# Patient Record
Sex: Male | Born: 1970
Health system: Southern US, Community
[De-identification: ages and names within clinical notes are randomized; demographics above are authoritative.]

## PROBLEM LIST (undated history)

## (undated) DIAGNOSIS — G4730 Sleep apnea, unspecified: Secondary | ICD-10-CM

## (undated) DIAGNOSIS — E119 Type 2 diabetes mellitus without complications: Secondary | ICD-10-CM

## (undated) DIAGNOSIS — E669 Obesity, unspecified: Secondary | ICD-10-CM

## (undated) DIAGNOSIS — M1990 Unspecified osteoarthritis, unspecified site: Secondary | ICD-10-CM

## (undated) DIAGNOSIS — K219 Gastro-esophageal reflux disease without esophagitis: Secondary | ICD-10-CM

## (undated) DIAGNOSIS — I10 Essential (primary) hypertension: Secondary | ICD-10-CM

## (undated) DIAGNOSIS — K5090 Crohn's disease, unspecified, without complications: Secondary | ICD-10-CM

## (undated) DIAGNOSIS — K589 Irritable bowel syndrome without diarrhea: Secondary | ICD-10-CM

## (undated) DIAGNOSIS — T7840XA Allergy, unspecified, initial encounter: Secondary | ICD-10-CM

## (undated) HISTORY — DX: Allergy, unspecified, initial encounter: T78.40XA

## (undated) HISTORY — PX: OTHER SURGICAL HISTORY: SHX169

## (undated) HISTORY — PX: COLONOSCOPY: SHX174

## (undated) HISTORY — DX: Unspecified osteoarthritis, unspecified site: M19.90

## (undated) HISTORY — DX: Sleep apnea, unspecified: G47.30

## (undated) HISTORY — DX: Type 2 diabetes mellitus without complications: E11.9

---

## 2005-07-31 ENCOUNTER — Emergency Department: Payer: Self-pay | Admitting: Emergency Medicine

## 2005-07-31 ENCOUNTER — Other Ambulatory Visit: Payer: Self-pay

## 2008-11-02 ENCOUNTER — Emergency Department: Payer: Self-pay | Admitting: Emergency Medicine

## 2009-12-01 ENCOUNTER — Emergency Department: Payer: Self-pay | Admitting: Emergency Medicine

## 2011-05-07 ENCOUNTER — Observation Stay: Payer: Self-pay | Admitting: Internal Medicine

## 2011-05-07 LAB — COMPREHENSIVE METABOLIC PANEL
Albumin: 3.7 g/dL (ref 3.4–5.0)
Alkaline Phosphatase: 57 U/L (ref 50–136)
Bilirubin,Total: 0.2 mg/dL (ref 0.2–1.0)
Co2: 26 mmol/L (ref 21–32)
Creatinine: 0.62 mg/dL (ref 0.60–1.30)
EGFR (Non-African Amer.): >60
Glucose: 152 mg/dL — ABNORMAL HIGH (ref 65–99)
Osmolality: 298 (ref 275–301)
SGOT(AST): 20 U/L (ref 15–37)
SGPT (ALT): 37 U/L
Sodium: 149 mmol/L — ABNORMAL HIGH (ref 136–145)

## 2011-05-07 LAB — CK TOTAL AND CKMB (NOT AT ARMC)
CK-MB: 1.7 ng/mL (ref 0.5–3.6)
CK-MB: 1.6 ng/mL (ref 0.5–3.6)
CK-MB: 1.5 ng/mL (ref 0.5–3.6)

## 2011-05-07 LAB — CBC
HCT: 40.1 % (ref 40.0–52.0)
MCH: 29.4 pg (ref 26.0–34.0)
MCV: 88 fL (ref 80–100)
RBC: 4.54 10*6/uL (ref 4.40–5.90)
RDW: 14.9 % — ABNORMAL HIGH (ref 11.5–14.5)
WBC: 8 10*3/uL (ref 3.8–10.6)

## 2011-11-04 ENCOUNTER — Emergency Department: Payer: Self-pay | Admitting: *Deleted

## 2013-03-22 ENCOUNTER — Inpatient Hospital Stay (HOSPITAL_COMMUNITY)
Admission: EM | Admit: 2013-03-22 | Discharge: 2013-03-24 | DRG: 282 | Disposition: A | Discharge status: Home / Self Care | Payer: Self-pay | Attending: Internal Medicine | Admitting: Internal Medicine

## 2013-03-22 ENCOUNTER — Encounter (HOSPITAL_COMMUNITY): Payer: Self-pay | Admitting: Emergency Medicine

## 2013-03-22 ENCOUNTER — Emergency Department (HOSPITAL_COMMUNITY): Payer: Self-pay

## 2013-03-22 DIAGNOSIS — D649 Anemia, unspecified: Secondary | ICD-10-CM | POA: Diagnosis present

## 2013-03-22 DIAGNOSIS — I214 Non-ST elevation (NSTEMI) myocardial infarction: Principal | ICD-10-CM | POA: Diagnosis present

## 2013-03-22 DIAGNOSIS — Z87891 Personal history of nicotine dependence: Secondary | ICD-10-CM

## 2013-03-22 DIAGNOSIS — I10 Essential (primary) hypertension: Secondary | ICD-10-CM | POA: Diagnosis present

## 2013-03-22 DIAGNOSIS — Z800 Family history of malignant neoplasm of digestive organs: Secondary | ICD-10-CM

## 2013-03-22 DIAGNOSIS — Z833 Family history of diabetes mellitus: Secondary | ICD-10-CM

## 2013-03-22 DIAGNOSIS — Z8249 Family history of ischemic heart disease and other diseases of the circulatory system: Secondary | ICD-10-CM

## 2013-03-22 DIAGNOSIS — R079 Chest pain, unspecified: Secondary | ICD-10-CM

## 2013-03-22 DIAGNOSIS — K219 Gastro-esophageal reflux disease without esophagitis: Secondary | ICD-10-CM | POA: Diagnosis present

## 2013-03-22 DIAGNOSIS — Z6839 Body mass index (BMI) 39.0-39.9, adult: Secondary | ICD-10-CM

## 2013-03-22 DIAGNOSIS — Z79899 Other long term (current) drug therapy: Secondary | ICD-10-CM

## 2013-03-22 DIAGNOSIS — K589 Irritable bowel syndrome without diarrhea: Secondary | ICD-10-CM | POA: Diagnosis present

## 2013-03-22 HISTORY — DX: Obesity, unspecified: E66.9

## 2013-03-22 HISTORY — DX: Essential (primary) hypertension: I10

## 2013-03-22 HISTORY — DX: Irritable bowel syndrome, unspecified: K58.9

## 2013-03-22 HISTORY — DX: Gastro-esophageal reflux disease without esophagitis: K21.9

## 2013-03-22 LAB — BASIC METABOLIC PANEL
BUN: 15 mg/dL (ref 6–23)
CO2: 27 mEq/L (ref 19–32)
Chloride: 105 mEq/L (ref 96–112)
Creatinine, Ser: 0.72 mg/dL (ref 0.50–1.35)
GFR calc Af Amer: >90 mL/min (ref >90)
GFR calc non Af Amer: >90 mL/min (ref >90)
Glucose, Bld: 115 mg/dL — ABNORMAL HIGH (ref 70–99)
Potassium: 3.8 mEq/L (ref 3.5–5.1)

## 2013-03-22 LAB — CBC WITH DIFFERENTIAL/PLATELET
Basophils Relative: 0 % (ref 0–1)
Eosinophils Absolute: 0.1 10*3/uL (ref 0.0–0.7)
HCT: 38 % — ABNORMAL LOW (ref 39.0–52.0)
Hemoglobin: 12.6 g/dL — ABNORMAL LOW (ref 13.0–17.0)
Lymphocytes Relative: 13 % (ref 12–46)
Lymphs Abs: 2.1 10*3/uL (ref 0.7–4.0)
MCH: 29.7 pg (ref 26.0–34.0)
MCHC: 33.2 g/dL (ref 30.0–36.0)
Monocytes Absolute: 1.3 10*3/uL — ABNORMAL HIGH (ref 0.1–1.0)
Monocytes Relative: 8 % (ref 3–12)
Neutrophils Relative %: 78 % — ABNORMAL HIGH (ref 43–77)
Platelets: 276 10*3/uL (ref 150–400)
RBC: 4.24 MIL/uL (ref 4.22–5.81)

## 2013-03-22 LAB — POCT I-STAT TROPONIN I: Troponin i, poc: 0.01 ng/mL (ref 0.00–0.08)

## 2013-03-22 MED ORDER — MORPHINE SULFATE 4 MG/ML IJ SOLN
4.0000 mg | Freq: Once | INTRAMUSCULAR | Status: AC
  Administered 2013-03-22: 4 mg via INTRAVENOUS
  Filled 2013-03-22: qty 1

## 2013-03-22 MED ORDER — MORPHINE SULFATE 4 MG/ML IJ SOLN: 4.0000 mg | Freq: Once | INTRAMUSCULAR | Status: DC

## 2013-03-22 MED ORDER — CALCIUM CARBONATE ANTACID 500 MG PO CHEW
1.0000 | CHEWABLE_TABLET | Freq: Once | ORAL | Status: AC
  Administered 2013-03-22: 200 mg via ORAL
  Filled 2013-03-22: qty 1

## 2013-03-22 MED ORDER — MORPHINE SULFATE 4 MG/ML IJ SOLN
5.0000 mg | Freq: Once | INTRAMUSCULAR | Status: AC
  Administered 2013-03-22: 5 mg via INTRAVENOUS
  Filled 2013-03-22: qty 2

## 2013-03-22 MED ORDER — NITROGLYCERIN 2 % TD OINT
1.0000 [in_us] | TOPICAL_OINTMENT | Freq: Four times a day (QID) | TRANSDERMAL | Status: DC
  Administered 2013-03-22: 1 [in_us] via TOPICAL
  Filled 2013-03-22: qty 1

## 2013-03-22 MED ORDER — ONDANSETRON HCL 4 MG/2ML IJ SOLN
INTRAMUSCULAR | Status: AC
  Filled 2013-03-22: qty 2

## 2013-03-22 MED ORDER — ONDANSETRON HCL 4 MG/2ML IJ SOLN
4.0000 mg | Freq: Once | INTRAMUSCULAR | Status: AC
  Administered 2013-03-22: 4 mg via INTRAVENOUS

## 2013-03-22 NOTE — ED Notes (Signed)
Pt up to b/r, belching as he moves, sudden onset of feeling better with large grandiose burp.

## 2013-03-22 NOTE — ED Notes (Signed)
EMS-pt reports sudden onset of crushing left sided chest pain associated with nausea and burping. Pain initially rated pain 8/10, pt given 5 nitros en route and pain decreased to 5/10. Pt reports headache now after nitro which he rates 3/10. 18(L) hand. Pt received 1500 en route due to blood pressure decreasing after nitro given. 324ASA given.

## 2013-03-22 NOTE — ED Notes (Signed)
EDP at bedside  

## 2013-03-22 NOTE — ED Notes (Signed)
O2 [place] re-placed back on pt, SPO2 RA 92%, c/o CP 5/10 improved after morphine, reports no change of pain after ntg given by EMS, has developed HA from ntg. Denies sx other than pain, alert, NAD, calm, interactive, resps e/u, speakingini clear complete setnences, CBIR, family x2 at [facility].

## 2013-03-22 NOTE — ED Notes (Signed)
HA gone. CP remains, comes and goes now. 3/10 at this time. No aggravating or aleviating factors, resting comfortably at this time.

## 2013-03-22 NOTE — ED Provider Notes (Signed)
CSN: [id number]     Arrival date & time 03/22/13  2026 History   First MD Initiated Contact with Patient 03/22/13 2030     Chief Complaint  Patient presents with  . Chest Pain   (Consider location/radiation/quality/duration/timing/severity/associated sxs/prior Treatment) Patient is a 42 y.o. male presenting with chest pain. The history is provided by the patient. No language interpreter was used.  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 Relieved by:  Nothing (improved w/ nitroglycerin) Worsened by:  Nothing tried Ineffective treatments:  Nitroglycerin Associated symptoms: diaphoresis and nausea   Associated symptoms: no abdominal pain, no back pain, no cough, no dizziness, no dysphagia, no fatigue, no fever, no headache, no numbness, no shortness of breath, no syncope, not vomiting and no weakness   Associated symptoms comment:  Tingling in L arm Risk factors: hypertension, male sex and obesity   Risk factors: no coronary artery disease, no diabetes mellitus, no prior DVT/PE and no smoking (quit 8 yrs)     Past Medical History  Diagnosis Date  . GERD (gastroesophageal reflux disease)   . Hypertension    History reviewed. No pertinent past surgical history. History reviewed. No pertinent family history. History  Substance Use Topics  . Smoking status: Never Smoker   . Smokeless tobacco: Not on file  . Alcohol Use: Not on file    Review of Systems  Constitutional: Positive for diaphoresis. Negative for fever, activity change, appetite change and fatigue.  HENT: Negative for congestion, facial swelling, rhinorrhea and trouble swallowing.   Eyes: Negative for photophobia and pain.  Respiratory: Negative for  cough, chest tightness and shortness of breath.   Cardiovascular: Positive for chest pain. Negative for leg swelling and syncope.  Gastrointestinal: Positive for nausea. Negative for vomiting, abdominal pain, diarrhea and constipation.  Endocrine: Negative for polydipsia and polyuria.  Genitourinary: Negative for dysuria, urgency, decreased urine volume and difficulty urinating.  Musculoskeletal: Negative for back pain and gait problem.  Skin: Negative for color change, rash and wound.  Allergic/Immunologic: Negative for immunocompromised state.  Neurological: Negative for dizziness, facial asymmetry, speech difficulty, weakness, numbness and headaches.  Psychiatric/Behavioral: Negative for confusion, decreased concentration and agitation.    Allergies  Review of patient's allergies indicates no known allergies.  Home Medications   Current Outpatient Rx  Name  Route  Sig  Dispense  Refill  . cetirizine (ZYRTEC) 10 MG tablet   Oral   Take 10 mg by mouth daily.         Marland Kitchen esomeprazole (NEXIUM) 40 MG capsule   Oral   Take 40 mg by mouth daily at 12 noon.         Marland Kitchen lisinopril (PRINIVIL,ZESTRIL) 10 MG tablet   Oral   Take 10 mg by mouth daily.          BP 124/75  Pulse 60  Temp(Src) 97.8 F (36.6 C) (Oral)  Resp 15  Ht 6\' 4"  (1.93 m)  Wt 320 lb (145.151 kg)  BMI 38.97 kg/m2  SpO2 98% Physical Exam  Constitutional: He is oriented to person, place, and time. He appears well-developed and well-nourished. No distress.  obese  HENT:  Head: Normocephalic and atraumatic.  Mouth/Throat: No oropharyngeal exudate.  Eyes: Pupils are equal, round, and reactive to light.  Neck: Normal range of motion. Neck supple.  Cardiovascular: Normal rate, regular rhythm and normal heart sounds.  Exam reveals no gallop and no friction rub.   No murmur heard. Pulmonary/Chest: Effort normal and breath sounds normal. No respiratory distress. He has no wheezes. He has no rales.  Abdominal:  Soft. Bowel sounds are normal. He exhibits no distension and no mass. There is no tenderness. There is no rebound and no guarding.  Musculoskeletal: Normal range of motion. He exhibits no edema and no tenderness.  Neurological: He is alert and oriented to person, place, and time.  Skin: Skin is warm and dry.  Psychiatric: He has a normal mood and affect.    ED Course  Procedures (including critical care time) Labs Review Labs Reviewed  CBC WITH DIFFERENTIAL - Abnormal; Notable for the following:    WBC 16.2 (*)    Hemoglobin 12.6 (*)    HCT 38.0 (*)    Neutrophils Relative % 78 (*)    Neutro Abs 12.7 (*)    Monocytes Absolute 1.3 (*)    All other components within normal limits  BASIC METABOLIC PANEL - Abnormal; Notable for the following:    Glucose, Bld 115 (*)    All other components within normal limits  POCT I-STAT TROPONIN I   Imaging Review Dg Chest Portable 1 View  03/22/2013   CLINICAL DATA:  Left chest pain.  EXAM: PORTABLE CHEST - 1 VIEW  COMPARISON:  None.  FINDINGS: Portable view of the chest demonstrates low lung volumes. No evidence for edema or focal airspace disease. Heart size is within normal limits.  IMPRESSION: Low lung volumes without focal disease.   Electronically Signed   By: Richarda Overlie M.D.   On: 03/22/2013 21:15    EKG Interpretation    Date/Time:  Thursday March 22 2013 20:36:12 EST Ventricular Rate:  56 PR Interval:  150 QRS Duration: 103 QT Interval:  476 QTC Calculation: 459 R Axis:   -15 Text Interpretation:  Sinus rhythm Borderline left axis deviation Nonspecific T abnormalities, diffuse leads Baseline wander in lead(s) II III aVR aVF V1 No previous tracing Confirmed by Ludella Pranger  MD, Ymani Porcher ([id number]) on 03/22/2013 9:00:43 PM Also confirmed by Micheline Maze  MD, Claudean Leavelle [phone])  on 03/22/2013 11:18:37 PM            MDM   1. Chest pain    Pt is a 42 y.o. male with Pmhx as above who presents with sudden onset severe chest tightness with assoc  nausea, diaphoresis, belching, and tingling in L arm.  Pain 8/10 initially, 6/10 upon my exam.  EKG as above w/o prior available.  NTG paste placed, pt given 6mg  morphine, peptobismal.  ASA given by EMS. CXR unremarkable, first trop negative.  However, I feel symptoms concerning for ACS.  Cardiology consulted, feels he is safe for admission to internal medicine.  [facility] will admit to stepdown as pt's pain currently 1/10.        Shanna Cisco, MD 03/23/13 [phone]

## 2013-03-22 NOTE — ED Notes (Signed)
EDP Dr. Micheline Maze in to room, at [facility] to update pt/family.

## 2013-03-23 ENCOUNTER — Inpatient Hospital Stay (HOSPITAL_COMMUNITY): Payer: Self-pay

## 2013-03-23 ENCOUNTER — Encounter (HOSPITAL_COMMUNITY): Payer: Self-pay | Admitting: Internal Medicine

## 2013-03-23 ENCOUNTER — Encounter (HOSPITAL_COMMUNITY)
Admission: EM | Disposition: A | Discharge status: Home / Self Care | Payer: Self-pay | Source: Home / Self Care | Attending: Internal Medicine

## 2013-03-23 DIAGNOSIS — I214 Non-ST elevation (NSTEMI) myocardial infarction: Secondary | ICD-10-CM | POA: Diagnosis present

## 2013-03-23 DIAGNOSIS — R079 Chest pain, unspecified: Secondary | ICD-10-CM

## 2013-03-23 DIAGNOSIS — I10 Essential (primary) hypertension: Secondary | ICD-10-CM | POA: Diagnosis present

## 2013-03-23 HISTORY — PX: LEFT HEART CATHETERIZATION WITH CORONARY ANGIOGRAM: SHX5451

## 2013-03-23 LAB — HEMOGLOBIN A1C
Hgb A1c MFr Bld: 5.3 % (ref <5.7)
Mean Plasma Glucose: 105 mg/dL (ref <117)

## 2013-03-23 LAB — CBC WITH DIFFERENTIAL/PLATELET
Basophils Relative: 0 % (ref 0–1)
Eosinophils Absolute: 0 10*3/uL (ref 0.0–0.7)
Eosinophils Relative: 0 % (ref 0–5)
HCT: 37.7 % — ABNORMAL LOW (ref 39.0–52.0)
Hemoglobin: 12.1 g/dL — ABNORMAL LOW (ref 13.0–17.0)
Lymphocytes Relative: 12 % (ref 12–46)
Lymphs Abs: 1.3 10*3/uL (ref 0.7–4.0)
MCH: 28.8 pg (ref 26.0–34.0)
MCHC: 32.1 g/dL (ref 30.0–36.0)
MCV: 89.8 fL (ref 78.0–100.0)
Monocytes Absolute: 0.5 10*3/uL (ref 0.1–1.0)
Monocytes Relative: 5 % (ref 3–12)
Platelets: 287 10*3/uL (ref 150–400)
RBC: 4.2 MIL/uL — ABNORMAL LOW (ref 4.22–5.81)

## 2013-03-23 LAB — LIPID PANEL
Cholesterol: 107 mg/dL (ref 0–200)
LDL Cholesterol: 64 mg/dL (ref 0–99)
Triglycerides: 52 mg/dL (ref <150)
VLDL: 10 mg/dL (ref 0–40)

## 2013-03-23 LAB — COMPREHENSIVE METABOLIC PANEL
ALT: 22 U/L (ref 0–53)
AST: 17 U/L (ref 0–37)
Albumin: 3.8 g/dL (ref 3.5–5.2)
CO2: 24 mEq/L (ref 19–32)
Chloride: 109 mEq/L (ref 96–112)
Creatinine, Ser: 0.54 mg/dL (ref 0.50–1.35)
GFR calc non Af Amer: >90 mL/min (ref >90)
Sodium: 142 mEq/L (ref 135–145)
Total Bilirubin: 0.3 mg/dL (ref 0.3–1.2)
Total Protein: 6.5 g/dL (ref 6.0–8.3)

## 2013-03-23 LAB — TROPONIN I
Troponin I: 2.22 ng/mL (ref <0.30)
Troponin I: 3.26 ng/mL (ref <0.30)

## 2013-03-23 LAB — PROTIME-INR
INR: 1.1 (ref 0.00–1.49)
Prothrombin Time: 14 seconds (ref 11.6–15.2)

## 2013-03-23 LAB — TSH: TSH: 0.782 u[IU]/mL (ref 0.350–4.500)

## 2013-03-23 SURGERY — LEFT HEART CATHETERIZATION WITH CORONARY ANGIOGRAM
Anesthesia: LOCAL

## 2013-03-23 MED ORDER — ACETAMINOPHEN 325 MG PO TABS
650.0000 mg | ORAL_TABLET | Freq: Four times a day (QID) | ORAL | Status: DC | PRN
Start: 2013-03-23 — End: 2013-03-24
  Administered 2013-03-23: 650 mg via ORAL
  Filled 2013-03-23: qty 2

## 2013-03-23 MED ORDER — ACETAMINOPHEN 325 MG PO TABS: 650.0000 mg | ORAL_TABLET | ORAL | Status: DC | PRN

## 2013-03-23 MED ORDER — ATORVASTATIN CALCIUM 80 MG PO TABS
80.0000 mg | ORAL_TABLET | Freq: Every day | ORAL | Status: DC
  Filled 2013-03-23 (×2): qty 1

## 2013-03-23 MED ORDER — HEPARIN SODIUM (PORCINE) 1000 UNIT/ML IJ SOLN
INTRAMUSCULAR | Status: AC
  Filled 2013-03-23: qty 1

## 2013-03-23 MED ORDER — ASPIRIN EC 325 MG PO TBEC
325.0000 mg | DELAYED_RELEASE_TABLET | Freq: Every day | ORAL | Status: DC
  Administered 2013-03-23 – 2013-03-24 (×2): 325 mg via ORAL
  Filled 2013-03-23 (×2): qty 1

## 2013-03-23 MED ORDER — LIDOCAINE HCL (PF) 1 % IJ SOLN
INTRAMUSCULAR | Status: AC
  Filled 2013-03-23: qty 30

## 2013-03-23 MED ORDER — NITROGLYCERIN 0.2 MG/ML ON CALL CATH LAB
INTRAVENOUS | Status: AC
  Filled 2013-03-23: qty 1

## 2013-03-23 MED ORDER — HEPARIN (PORCINE) IN NACL 2-0.9 UNIT/ML-% IJ SOLN
INTRAMUSCULAR | Status: AC
  Filled 2013-03-23: qty 1000

## 2013-03-23 MED ORDER — ONDANSETRON HCL 4 MG/2ML IJ SOLN: 4.0000 mg | Freq: Four times a day (QID) | INTRAMUSCULAR | Status: DC | PRN

## 2013-03-23 MED ORDER — HEPARIN BOLUS VIA INFUSION
4000.0000 [IU] | Freq: Once | INTRAVENOUS | Status: AC
  Administered 2013-03-23: 4000 [IU] via INTRAVENOUS
  Filled 2013-03-23: qty 4000

## 2013-03-23 MED ORDER — SODIUM CHLORIDE 0.9 % IV SOLN
INTRAVENOUS | Status: DC
  Administered 2013-03-23: 125 mL/h via INTRAVENOUS

## 2013-03-23 MED ORDER — LISINOPRIL 10 MG PO TABS
10.0000 mg | ORAL_TABLET | Freq: Every day | ORAL | Status: DC
  Administered 2013-03-23 – 2013-03-24 (×2): 10 mg via ORAL
  Filled 2013-03-23 (×2): qty 1

## 2013-03-23 MED ORDER — ASPIRIN 81 MG PO CHEW
81.0000 mg | CHEWABLE_TABLET | ORAL | Status: AC
  Administered 2013-03-23: 81 mg via ORAL
  Filled 2013-03-23: qty 1

## 2013-03-23 MED ORDER — PANTOPRAZOLE SODIUM 40 MG PO TBEC
40.0000 mg | DELAYED_RELEASE_TABLET | Freq: Every day | ORAL | Status: DC
  Administered 2013-03-23 – 2013-03-24 (×2): 40 mg via ORAL
  Filled 2013-03-23 (×2): qty 1

## 2013-03-23 MED ORDER — MIDAZOLAM HCL 2 MG/2ML IJ SOLN
INTRAMUSCULAR | Status: AC
  Filled 2013-03-23: qty 2

## 2013-03-23 MED ORDER — ONDANSETRON HCL 4 MG PO TABS: 4.0000 mg | ORAL_TABLET | Freq: Four times a day (QID) | ORAL | Status: DC | PRN

## 2013-03-23 MED ORDER — SODIUM CHLORIDE 0.9 % IJ SOLN
3.0000 mL | Freq: Two times a day (BID) | INTRAMUSCULAR | Status: DC
  Administered 2013-03-23: 3 mL via INTRAVENOUS

## 2013-03-23 MED ORDER — NITROGLYCERIN 0.4 MG/HR TD PT24
0.4000 mg | MEDICATED_PATCH | Freq: Every day | TRANSDERMAL | Status: DC
  Administered 2013-03-23: 0.4 mg via TRANSDERMAL
  Filled 2013-03-23 (×2): qty 1

## 2013-03-23 MED ORDER — LORATADINE 10 MG PO TABS
10.0000 mg | ORAL_TABLET | Freq: Every day | ORAL | Status: DC
  Administered 2013-03-23 – 2013-03-24 (×2): 10 mg via ORAL
  Filled 2013-03-23 (×2): qty 1

## 2013-03-23 MED ORDER — VERAPAMIL HCL 2.5 MG/ML IV SOLN
INTRAVENOUS | Status: AC
  Filled 2013-03-23: qty 2

## 2013-03-23 MED ORDER — IOHEXOL 350 MG/ML SOLN
80.0000 mL | Freq: Once | INTRAVENOUS | Status: AC | PRN
  Administered 2013-03-23: 80 mL via INTRAVENOUS

## 2013-03-23 MED ORDER — ACETAMINOPHEN 650 MG RE SUPP: 650.0000 mg | Freq: Four times a day (QID) | RECTAL | Status: DC | PRN

## 2013-03-23 MED ORDER — HEPARIN (PORCINE) IN NACL 100-0.45 UNIT/ML-% IJ SOLN
1750.0000 [IU]/h | INTRAMUSCULAR | Status: DC
  Administered 2013-03-23: 1750 [IU]/h via INTRAVENOUS
  Filled 2013-03-23 (×2): qty 250

## 2013-03-23 MED ORDER — ENOXAPARIN SODIUM 40 MG/0.4ML ~~LOC~~ SOLN: 40.0000 mg | Freq: Every day | SUBCUTANEOUS | Status: DC

## 2013-03-23 MED ORDER — FENTANYL CITRATE 0.05 MG/ML IJ SOLN
INTRAMUSCULAR | Status: AC
  Filled 2013-03-23: qty 2

## 2013-03-23 NOTE — Progress Notes (Signed)
CRITICAL VALUE ALERT  Critical value received:  Troponin 0.32   Date of notification:  03/23/13 Time of notification:  0347  Critical value read back:yes  Nurse who received alert:  Clovis Fredrickson  MD notified (1st page):  Toniann Fail  Time of first page:  [phone]  Responding MD:  Toniann Fail  Time MD responded:  [phone]

## 2013-03-23 NOTE — Progress Notes (Signed)
ANTICOAGULATION CONSULT NOTE - Initial Consult  Pharmacy Consult for Heparin Indication: chest pain/ACS  No Known Allergies  Patient Measurements: Height: 6\' 4"  (193 cm) Weight: 327 lb 3.2 oz (148.417 kg) IBW/kg (Calculated) : 86.8 Heparin Dosing Weight: 120 kg   Vital Signs: Temp: 97.9 F (36.6 C) (12/12 0128) Temp src: Oral (12/12 0128) BP: 158/89 mmHg (12/12 0128) Pulse Rate: 71 (12/12 0128)  Labs:  Recent Labs  03/22/13 2125 03/23/13 0230  HGB 12.6* 12.1*  HCT 38.0* 37.7*  PLT 276 287  CREATININE 0.72 0.54  TROPONINI  --  0.32*    Estimated Creatinine Clearance: 185.7 ml/min (by C-G formula based on Cr of 0.54).   Medical History: Past Medical History  Diagnosis Date  . GERD (gastroesophageal reflux disease)   . Hypertension     Medications:  Prescriptions prior to admission  Medication Sig Dispense Refill  . cetirizine (ZYRTEC) 10 MG tablet Take 10 mg by mouth daily.      Marland Kitchen esomeprazole (NEXIUM) 40 MG capsule Take 40 mg by mouth daily at 12 noon.      Marland Kitchen lisinopril (PRINIVIL,ZESTRIL) 10 MG tablet Take 10 mg by mouth daily.        Assessment: 42 y.o. male with chest pain for heparin   Goal of Therapy:  Heparin level 0.3-0.7 units/ml Monitor platelets by anticoagulation protocol: Yes   Plan:  Heparin 4000 units IV bolus, then 1750 units/hr Check heparin level in 6 hours.  Faithanne Verret, Gary Fleet 03/23/2013,3:55 AM

## 2013-03-23 NOTE — Progress Notes (Signed)
ANTICOAGULATION CONSULT NOTE - Follow Up Consult  Pharmacy Consult for Heparin Indication: chest pain/ACS  No Known Allergies  Patient Measurements: Height: 6\' 4"  (193 cm) Weight: 327 lb 3.2 oz (148.417 kg) IBW/kg (Calculated) : 86.8 Heparin Dosing Weight: 120kg  Vital Signs: Temp: 98.2 F (36.8 C) (12/12 1500) Temp src: Oral (12/12 1500) BP: 152/79 mmHg (12/12 1500) Pulse Rate: 70 (12/12 1500)  Labs:  Recent Labs  03/22/13 2125 03/23/13 0230 03/23/13 0835 03/23/13 1000 03/23/13 1455  HGB 12.6* 12.1*  --   --   --   HCT 38.0* 37.7*  --   --   --   PLT 276 287  --   --   --   LABPROT  --   --   --  14.0  --   INR  --   --   --  1.10  --   HEPARINUNFRC  --   --   --  0.35  --   CREATININE 0.72 0.54  --   --   --   TROPONINI  --  0.32* 2.22*  --  3.26*    Estimated Creatinine Clearance: 185.7 ml/min (by C-G formula based on Cr of 0.54).    Assessment: Cath was neg today. Heparin ordered post cath if PE is found on CT. CT results are back and no PE.  Goal of Therapy:  Heparin level 0.3-0.7 units/ml Monitor platelets by anticoagulation protocol: Yes   Plan:   No need for heparin Rx will sign off

## 2013-03-23 NOTE — Progress Notes (Signed)
ANTICOAGULATION CONSULT NOTE - Follow Up Consult  Pharmacy Consult for Heparin Indication: chest pain/ACS  No Known Allergies  Patient Measurements: Height: 6\' 4"  (193 cm) Weight: 327 lb 3.2 oz (148.417 kg) IBW/kg (Calculated) : 86.8 Heparin Dosing Weight: 120kg  Vital Signs: Temp: 98.2 F (36.8 C) (12/12 0453) Temp src: Oral (12/12 0453) BP: 131/68 mmHg (12/12 0453) Pulse Rate: 65 (12/12 0453)  Labs:  Recent Labs  03/22/13 2125 03/23/13 0230 03/23/13 0835 03/23/13 1000  HGB 12.6* 12.1*  --   --   HCT 38.0* 37.7*  --   --   PLT 276 287  --   --   LABPROT  --   --   --  14.0  INR  --   --   --  1.10  HEPARINUNFRC  --   --   --  0.35  CREATININE 0.72 0.54  --   --   TROPONINI  --  0.32* 2.22*  --     Estimated Creatinine Clearance: 185.7 ml/min (by C-G formula based on Cr of 0.54).   Medications:  Heparin @ 1750 units/hr  Assessment: 42yom started on IV heparin this morning for chest pain. Troponins rising 0.32 to 2.22. Initial heparin level is therapeutic. Plan is for cath today.  Goal of Therapy:  Heparin level 0.3-0.7 units/ml Monitor platelets by anticoagulation protocol: Yes   Plan:  1) Continue heparin at 1750 units/hr 2) Follow up after cath  Fredrik Rigger 03/23/2013,11:10 AM

## 2013-03-23 NOTE — Consult Note (Signed)
Cardiology Consultation Note  Patient ID: Steve Beck, MRN: [id number], DOB/AGE: 16-Jan-1971 42 y.o. 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 He reports a normal stress test approximately 6 months ago at [facility] when he was having CP in the setting of increased stress at home. Otherwise he has no prior cardiac history. Around 7pm last night while assisting a customer at [employer] he felt a left sided chest pressure with associated left arm tingling. He felt a sense of anxiety but denied overt SOB, palpitations, nausea, vomiting, dizziness or diaphoresis associated with this episode. He told his manager to call [phone]. EMS arrived and gave him 4 baby aspirin and also 5 NTG. He isn't sure if the NTG helped. In the ED he received 2 doses of morphine, Tums, and Zofran with eventual relief of the intermittent CP around 3am. Nothing made the pain worse including inspiration, palpation, movement, exertion. No radiation to back and not ripping/tearing in quality. He has not had any exertional symptoms recently. No recent travel, injury, bedrest. He's had a "cold" on/off for a month and 50lb weight loss since going back to work and being more active. Denies recent GIB (rare BRBPR in the past a/w his IBS), syncope, LEE, orthopnea, fever, chills. He has a h/o infrequent palpitations usually exacerbated by caffeine - this feels like an extra beat or so. He reports feeling this around 5:30 and telemetry shows NSR/SA with an isolated PVC; no other arrhythmias. Initial troponin was negative then 2nd one 0.32, WBC 16k->11k, afebrile, not hypoxic. He is mildly anemic at 12.6 and  hyperglycemic at 118 otherwise labs nonacute. He was started on heparin by the primary team. He is currently CP free.  Past Medical History  Diagnosis Date  . GERD (gastroesophageal reflux disease)   . Hypertension   . Morbid obesity   . IBS (irritable bowel syndrome)       Most Recent Cardiac Studies: Stress test 6 months ago at [facility], reportedly OK per patient, no records available   Surgical History:  Past Surgical History  Procedure Laterality Date  . None       Home Meds: Prior to Admission medications   Medication Sig Start Date End Date Taking? Authorizing Provider  cetirizine (ZYRTEC) 10 MG tablet Take 10 mg by mouth daily.   Yes Historical Provider, MD  esomeprazole (NEXIUM) 40 MG capsule Take 40 mg by mouth daily at 12 noon.   Yes Historical Provider, MD  lisinopril (PRINIVIL,ZESTRIL) 10 MG tablet Take 10 mg by mouth daily.   Yes Historical Provider, MD    Inpatient Medications:  . aspirin EC  325 mg Oral Daily  . lisinopril  10 mg Oral Daily  . loratadine  10 mg Oral Daily  . nitroGLYCERIN  0.4 mg Transdermal Daily  . pantoprazole  40 mg Oral Daily  . sodium chloride  3 mL Intravenous Q12H  . sodium chloride  3 mL Intravenous Q12H   . heparin 1,750 Units/hr (03/23/13 0408)    Allergies: No Known Allergies  History   Social History  . Marital Status: Legally Separated    Spouse Name: N/A    Number of Children: N/A  .  Years of Education: N/A   Occupational History  .      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   Social History Main Topics  . Smoking status: Former Games developer  . Smokeless tobacco: Not on file     Comment: Smoked for 17 years - quit ~2006  . Alcohol Use: No  . Drug Use: No  . Sexual Activity: Not on file   Other Topics Concern  . Not on file   Social History Narrative  . No narrative on file     Family History  Problem Relation Age of Onset  . Esophageal cancer Father   . Diabetes Mellitus II Other   . Pulmonary  embolism Father   . Hypertension Mother     H/o heart murmur but no CAD  . Migraines Mother      Review of Systems General: negative for chills, fever Cardiovascular: see above Dermatological: negative for rash Respiratory: negative for wheezing, cough Urologic: negative for hematuria Abdominal: negative for nausea, vomiting, diarrhea, melena, or hematemesis Neurologic: negative for visual changes, syncope, or dizziness. No h/o TIA/CVA All other systems reviewed and are otherwise negative except as noted above.  Labs:  Recent Labs  03/23/13 0230  TROPONINI 0.32*   Lab Results  Component Value Date   WBC 11.1* 03/23/2013   HGB 12.1* 03/23/2013   HCT 37.7* 03/23/2013   MCV 89.8 03/23/2013   PLT 287 03/23/2013     Recent Labs Lab 03/23/13 0230  NA 142  K 4.0  CL 109  CO2 24  BUN 14  CREATININE 0.54  CALCIUM 8.6  PROT 6.5  BILITOT 0.3  ALKPHOS 67  ALT 22  AST 17  GLUCOSE 118*   Radiology/Studies:  Dg Chest Portable 1 View 03/22/2013   CLINICAL DATA:  Left chest pain.  EXAM: PORTABLE CHEST - 1 VIEW  COMPARISON:  None.  FINDINGS: Portable view of the chest demonstrates low lung volumes. No evidence for edema or focal airspace disease. Heart size is within normal limits.  IMPRESSION: Low lung volumes without focal disease.   Electronically Signed   By: Richarda Overlie M.D.   On: 03/22/2013 21:15   EKG:NSR 56bpm, twI I, II, V4-V6, biphasic T V3, T wave flattening III, avL, avF. No ST elevation. No prior to compare to.  Physical Exam: Blood pressure 131/68, pulse 65, temperature 98.2 F (36.8 C), temperature source Oral, resp. rate 18, height 6\' 4"  (1.93 m), weight 327 lb 3.2 oz (148.417 kg), SpO2 95.00%. General: Well developed, well nourished obese WM in no acute distress. Head: Normocephalic, atraumatic, sclera non-icteric, no xanthomas, nares are without discharge.  Neck: Negative for carotid bruits. JVD not elevated. Lungs: Clear bilaterally to auscultation without  wheezes, rales, or rhonchi. Breathing is unlabored. Heart: RRR with S1 S2. No murmurs, rubs, or gallops appreciated. Abdomen: Soft, non-tender, non-distended with normoactive bowel sounds. No hepatomegaly. No rebound/guarding. No obvious abdominal masses. Msk:  Strength and tone appear normal for age. Extremities: No clubbing or cyanosis. No edema.  Distal pedal pulses are 2+ and equal bilaterally. Neuro: Alert and oriented X 3. No facial asymmetry. No focal deficit. Moves all extremities spontaneously. Psych:  Responds to questions appropriately with a normal affect.   Assessment and Plan:  1. Chest pain concerning for unstable angina, mildly elevated troponin (possible NSTEMI) 2. H/o HTN, controlled 3. Former tobacco abuse 4. Obesity BMI 39.8  5. Mild anemia without reported recent bleeding  Symptoms concerning for angina given elevated troponin &  abnormal ECG. Will plan cardiac cath today to define coronary anatomy. Risks and benefits of cardiac catheterization have been discussed with the patient. These include bleeding, infection, kidney damage, stroke, heart attack, death. The patient understands these risks and is willing to proceed. If cath is unrevealing would need to consider alternative diagnosis such as PE. Dissection felt less likely at this time given quality of pain and not hypertensive. Continue ASA, ACEI, NTG patch. Hold off on beta blocker given sinus bradycardia. Start statin and check lipid panel. Check A1C. We also congratulated him on his weight loss and encouraged further healthy lifestyle.  Signed, Steve Spies PA-C 03/23/2013, 7:23 AM   Attending Note:   The patient was seen and examined.  Agree with assessment and plan as noted above.  Changes made to the above note as needed.  His symptoms are somewhat worrisome.  His troponin is minimally elevated but his has TWI in the anterior lateral leads.   His CP is not pleuretic.  No ripping or tearing sensation.  Doubt  aortic dissection (  AP CXR looks OK)   Agree with plans for cath.  Vesta Mixer, Montez Hageman., MD, [facility] 03/23/2013, 11:30 AM

## 2013-03-23 NOTE — Care Management Note (Unsigned)
    Page 1 of 1   03/23/2013     3:40:54 PM   CARE MANAGEMENT NOTE 03/23/2013  Patient:  Steve Beck, Steve Beck   Account Number:  [id number]  Date Initiated:  03/23/2013  Documentation initiated by:  Carmel Garfield  Subjective/Objective Assessment:   PT ADM ON 12/11 WITH CP, R/O MI.  PTA, PT INDEPENDENT OF ADLS.     Action/Plan:   WILL FOLLOW FOR HOME NEEDS AS PT PROGRESSES.   Anticipated DC Date:  03/24/2013   Anticipated DC Plan:  HOME/SELF CARE      DC Planning Services  CM consult      Choice offered to / List presented to:             Status of service:  In process, will continue to follow Medicare Important Message given?   (If response is "NO", the following Medicare IM given date fields will be blank) Date Medicare IM given:   Date Additional Medicare IM given:    Discharge Disposition:    Per UR Regulation:  Reviewed for med. necessity/level of care/duration of stay  If discussed at Long Length of Stay Meetings, dates discussed:    Comments:

## 2013-03-23 NOTE — Progress Notes (Signed)
[facility] PROGRESS NOTE  ABHI MOCCIA ZOX:[id number] DOB: 03-29-1971 DOA: 03/22/2013 PCP: No PCP Per Patient  Assessment/Plan: 1. Chest pain: cath today shows no CAD and normal LV function.  Awaiting CTA to ro PE. Patient currently asymptomatic 2. Hypertension: controled continue home medications 3. Anemia: mild anemia, MCV is normal. Check ferritn 4. Leucocytosis: trending downward. No fever. No clear etiology. Continue to monitor.  Code Status: full Family Communication: spoke with patient and wife at bedside Disposition Plan: await CTA result. If Ok then can go in am.   Consultants:  cardiology  Procedures:  Cardiac catheterization  Antibiotics:  none  HPI/Subjective: 42 yo m with phm htn presented on 03/22/13 with sudden onset left sided chest pain radiating to the left arm.  Cardiac catheterization shows no CAD and normal LV function. Currently asymptomatic.  Awaiting CTA.  He has no complaints at this time. He would like to eat.  Objective: Filed Vitals:   03/23/13 1227  BP:   Pulse: 85  Temp:   Resp:     Intake/Output Summary (Last 24 hours) at 03/23/13 1446 Last data filed at 03/23/13 0730  Gross per 24 hour  Intake   1000 ml  Output      0 ml  Net   1000 ml   Filed Weights   03/22/13 2033 03/23/13 0128  Weight: 145.151 kg (320 lb) 148.417 kg (327 lb 3.2 oz)    Exam:   General:  NAD, comfortable in the bed  Cardiovascular: RRR no mrg  Respiratory: CTAB  Abdomen: bs increased, soft, non tender  Data Reviewed: Basic Metabolic Panel:  Recent Labs Lab 03/22/13 2125 03/23/13 0230  NA 141 142  K 3.8 4.0  CL 105 109  CO2 27 24  GLUCOSE 115* 118*  BUN 15 14  CREATININE 0.72 0.54  CALCIUM 8.8 8.6   Liver Function Tests:  Recent Labs Lab 03/23/13 0230  AST 17  ALT 22  ALKPHOS 67  BILITOT 0.3  PROT 6.5  ALBUMIN 3.8   No results found for this basename: LIPASE, AMYLASE,  in the last 168 hours No results found for this  basename: AMMONIA,  in the last 168 hours CBC:  Recent Labs Lab 03/22/13 2125 03/23/13 0230  WBC 16.2* 11.1*  NEUTROABS 12.7* 9.3*  HGB 12.6* 12.1*  HCT 38.0* 37.7*  MCV 89.6 89.8  PLT 276 287   Cardiac Enzymes:  Recent Labs Lab 03/23/13 0230 03/23/13 0835  TROPONINI 0.32* 2.22*   BNP (last 3 results) No results found for this basename: PROBNP,  in the last 8760 hours CBG: No results found for this basename: GLUCAP,  in the last 168 hours  No results found for this or any previous visit (from the past 240 hour(s)).   Studies: Dg Chest Portable 1 View  03/22/2013   CLINICAL DATA:  Left chest pain.  EXAM: PORTABLE CHEST - 1 VIEW  COMPARISON:  None.  FINDINGS: Portable view of the chest demonstrates low lung volumes. No evidence for edema or focal airspace disease. Heart size is within normal limits.  IMPRESSION: Low lung volumes without focal disease.   Electronically Signed   By: Richarda Overlie M.D.   On: 03/22/2013 21:15    Scheduled Meds: . aspirin EC  325 mg Oral Daily  . atorvastatin  80 mg Oral q1800  . lisinopril  10 mg Oral Daily  . loratadine  10 mg Oral Daily  . nitroGLYCERIN  0.4 mg Transdermal Daily  . pantoprazole  40 mg Oral Daily  . sodium chloride  3 mL Intravenous Q12H  . sodium chloride  3 mL Intravenous Q12H   Continuous Infusions:   Principal Problem:   Chest pain Active Problems:   HTN (hypertension)   NSTEMI (non-ST elevated myocardial infarction)    Time spent: 35 minutes    [facility]  [facility] Pager [phone] If 7PM-7AM, please contact night-coverage at [email or web address], password [email or web address] 03/23/2013, 2:46 PM  LOS: 1 day

## 2013-03-23 NOTE — Progress Notes (Signed)
UR completed 

## 2013-03-23 NOTE — CV Procedure (Signed)
    Cardiac Catheterization Procedure Note  Name: Steve Beck MRN: [id number] DOB: Aug 31, 1970  Procedure: Left Heart Cath, Selective Coronary Angiography, LV angiography  Indication: NSTEMI   Procedural Details: The right wrist was prepped, draped, and anesthetized with 1% lidocaine. Using the modified Seldinger technique, a 5 French sheath was introduced into the right radial artery. 3 mg of verapamil was administered through the sheath, weight-based unfractionated heparin was administered intravenously. Standard Judkins catheters were used for selective coronary angiography and left ventriculography. Catheter exchanges were performed over an exchange length guidewire. There were no immediate procedural complications. A TR band was used for radial hemostasis at the completion of the procedure.  The patient was transferred to the post catheterization recovery area for further monitoring.  Procedural Findings: Hemodynamics: AO 135/87 LV 143/21  Coronary angiography: Coronary dominance: right  Left mainstem: No significant coronary disease.  Left anterior descending (LAD): No significant coronary disease.   Left circumflex (LCx): No significant coronary disease.   Right coronary artery (RCA): No significant coronary disease.   Left ventriculography: Left ventricular systolic function is normal, LVEF is estimated at 65%, there is no significant mitral regurgitation, no regional wall motion abnormalities.   Final Conclusions:  No angiographic CAD and normal LV systolic function, no explanation for elevated troponin.   Recommendations: Would workup for pulmonary embolus.    Marca Ancona 03/23/2013, 1:11 PM

## 2013-03-23 NOTE — H&P (View-Only) (Signed)
 Cardiology Consultation Note  Patient ID: Steve Beck, MRN: [id number], DOB/AGE: 12/30/1970 42 y.o. 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 He reports a normal stress test approximately 6 months ago at [facility] when he was having CP in the setting of increased stress at home. Otherwise he has no prior cardiac history. Around 7pm last night while assisting a customer at [employer] he felt a left sided chest pressure with associated left arm tingling. He felt a sense of anxiety but denied overt SOB, palpitations, nausea, vomiting, dizziness or diaphoresis associated with this episode. He told his manager to call 911. EMS arrived and gave him 4 baby aspirin and also 5 NTG. He isn't sure if the NTG helped. In the ED he received 2 doses of morphine, Tums, and Zofran with eventual relief of the intermittent CP around 3am. Nothing made the pain worse including inspiration, palpation, movement, exertion. No radiation to back and not ripping/tearing in quality. He has not had any exertional symptoms recently. No recent travel, injury, bedrest. He's had a "cold" on/off for a month and 50lb weight loss since going back to work and being more active. Denies recent GIB (rare BRBPR in the past a/w his IBS), syncope, LEE, orthopnea, fever, chills. He has a h/o infrequent palpitations usually exacerbated by caffeine - this feels like an extra beat or so. He reports feeling this around 5:30 and telemetry shows NSR/SA with an isolated PVC; no other arrhythmias. Initial troponin was negative then 2nd one 0.32, WBC 16k->11k, afebrile, not hypoxic. He is mildly anemic at 12.6 and  hyperglycemic at 118 otherwise labs nonacute. He was started on heparin by the primary team. He is currently CP free.  Past Medical History  Diagnosis Date  . GERD (gastroesophageal reflux disease)   . Hypertension   . Morbid obesity   . IBS (irritable bowel syndrome)       Most Recent Cardiac Studies: Stress test 6 months ago at [facility], reportedly OK per patient, no records available   Surgical History:  Past Surgical History  Procedure Laterality Date  . None       Home Meds: Prior to Admission medications   Medication Sig Start Date End Date Taking? Authorizing Provider  cetirizine (ZYRTEC) 10 MG tablet Take 10 mg by mouth daily.   Yes Historical Provider, MD  esomeprazole (NEXIUM) 40 MG capsule Take 40 mg by mouth daily at 12 noon.   Yes Historical Provider, MD  lisinopril (PRINIVIL,ZESTRIL) 10 MG tablet Take 10 mg by mouth daily.   Yes Historical Provider, MD    Inpatient Medications:  . aspirin EC  325 mg Oral Daily  . lisinopril  10 mg Oral Daily  . loratadine  10 mg Oral Daily  . nitroGLYCERIN  0.4 mg Transdermal Daily  . pantoprazole  40 mg Oral Daily  . sodium chloride  3 mL Intravenous Q12H  . sodium chloride  3 mL Intravenous Q12H   . heparin 1,750 Units/hr (03/23/13 0408)    Allergies: No Known Allergies  History   Social History  . Marital Status: Legally Separated    Spouse Name: N/A    Number of Children: N/A  .   Years of Education: N/A   Occupational History  .      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   Social History Main Topics  . Smoking status: Former Smoker  . Smokeless tobacco: Not on file     Comment: Smoked for 17 years - quit ~2006  . Alcohol Use: No  . Drug Use: No  . Sexual Activity: Not on file   Other Topics Concern  . Not on file   Social History Narrative  . No narrative on file     Family History  Problem Relation Age of Onset  . Esophageal cancer Father   . Diabetes Mellitus II Other   . Pulmonary  embolism Father   . Hypertension Mother     H/o heart murmur but no CAD  . Migraines Mother      Review of Systems General: negative for chills, fever Cardiovascular: see above Dermatological: negative for rash Respiratory: negative for wheezing, cough Urologic: negative for hematuria Abdominal: negative for nausea, vomiting, diarrhea, melena, or hematemesis Neurologic: negative for visual changes, syncope, or dizziness. No h/o TIA/CVA All other systems reviewed and are otherwise negative except as noted above.  Labs:  Recent Labs  03/23/13 0230  TROPONINI 0.32*   Lab Results  Component Value Date   WBC 11.1* 03/23/2013   HGB 12.1* 03/23/2013   HCT 37.7* 03/23/2013   MCV 89.8 03/23/2013   PLT 287 03/23/2013     Recent Labs Lab 03/23/13 0230  NA 142  K 4.0  CL 109  CO2 24  BUN 14  CREATININE 0.54  CALCIUM 8.6  PROT 6.5  BILITOT 0.3  ALKPHOS 67  ALT 22  AST 17  GLUCOSE 118*   Radiology/Studies:  Dg Chest Portable 1 View 03/22/2013   CLINICAL DATA:  Left chest pain.  EXAM: PORTABLE CHEST - 1 VIEW  COMPARISON:  None.  FINDINGS: Portable view of the chest demonstrates low lung volumes. No evidence for edema or focal airspace disease. Heart size is within normal limits.  IMPRESSION: Low lung volumes without focal disease.   Electronically Signed   By: Adam  Henn M.D.   On: 03/22/2013 21:15   EKG:NSR 56bpm, twI I, II, V4-V6, biphasic T V3, T wave flattening III, avL, avF. No ST elevation. No prior to compare to.  Physical Exam: Blood pressure 131/68, pulse 65, temperature 98.2 F (36.8 C), temperature source Oral, resp. rate 18, height 6' 4" (1.93 m), weight 327 lb 3.2 oz (148.417 kg), SpO2 95.00%. General: Well developed, well nourished obese WM in no acute distress. Head: Normocephalic, atraumatic, sclera non-icteric, no xanthomas, nares are without discharge.  Neck: Negative for carotid bruits. JVD not elevated. Lungs: Clear bilaterally to auscultation without  wheezes, rales, or rhonchi. Breathing is unlabored. Heart: RRR with S1 S2. No murmurs, rubs, or gallops appreciated. Abdomen: Soft, non-tender, non-distended with normoactive bowel sounds. No hepatomegaly. No rebound/guarding. No obvious abdominal masses. Msk:  Strength and tone appear normal for age. Extremities: No clubbing or cyanosis. No edema.  Distal pedal pulses are 2+ and equal bilaterally. Neuro: Alert and oriented X 3. No facial asymmetry. No focal deficit. Moves all extremities spontaneously. Psych:  Responds to questions appropriately with a normal affect.   Assessment and Plan:  1. Chest pain concerning for unstable angina, mildly elevated troponin (possible NSTEMI) 2. H/o HTN, controlled 3. Former tobacco abuse 4. Obesity BMI 39.8  5. Mild anemia without reported recent bleeding  Symptoms concerning for angina given elevated troponin &   abnormal ECG. Will plan cardiac cath today to define coronary anatomy. Risks and benefits of cardiac catheterization have been discussed with the patient. These include bleeding, infection, kidney damage, stroke, heart attack, death. The patient understands these risks and is willing to proceed. If cath is unrevealing would need to consider alternative diagnosis such as PE. Dissection felt less likely at this time given quality of pain and not hypertensive. Continue ASA, ACEI, NTG patch. Hold off on beta blocker given sinus bradycardia. Start statin and check lipid panel. Check A1C. We also congratulated him on his weight loss and encouraged further healthy lifestyle.  Signed, Dayna Dunn PA-C 03/23/2013, 7:23 AM   Attending Note:   The patient was seen and examined.  Agree with assessment and plan as noted above.  Changes made to the above note as needed.  His symptoms are somewhat worrisome.  His troponin is minimally elevated but his has TWI in the anterior lateral leads.   His CP is not pleuretic.  No ripping or tearing sensation.  Doubt  aortic dissection (  AP CXR looks OK)   Agree with plans for cath.  Philip J. Nahser, Jr., MD, FACC 03/23/2013, 11:30 AM   

## 2013-03-23 NOTE — H&P (Addendum)
[facility] History and Physical  DOMINIK YORDY OZH:[id number] DOB: 1970/11/27 DOA: 03/22/2013  Referring physician: ER physician. PCP: No PCP Per [facility].  Chief Complaint: Chest pain.  HPI: Steve Beck is a 42 y.o. male with history of hypertension and former cigarette smoker(smoked for 17 years quit 8 years ago) presents with complaints of chest pain. Patient states that he was at work when he started developing retrosternal pressure-like symptoms nonradiating but had diaphoresis and nausea. Denies any associated shortness of breath palpitations dizziness. In the ER EKG shows nonspecific T-wave changes in the lateral inferior leads. No old EKG to compare. Cardiac markers were negative. Patient's chest pain only subsided after placing nitroglycerin patch. Presently patient is chest pain-free and has been admitted for further management. Patient states that he had a similar episode in 2013 been he was admitted at [facility] and had undergone stress test which as per the patient was unremarkable.   Review of Systems: As presented in the history of presenting illness, rest negative.  Past Medical History  Diagnosis Date  . GERD (gastroesophageal reflux disease)   . Hypertension    History reviewed. No pertinent past surgical history. Social History:  reports that he has quit smoking. He does not have any smokeless tobacco history on file. He reports that he does not drink alcohol or use illicit drugs. Where does patient live at home. Can patient participate in ADLs? Yes.  No Known Allergies  Family History:  Family History  Problem Relation Age of Onset  . Esophageal cancer Father   . Diabetes Mellitus II Other       Prior to Admission medications   Medication Sig Start Date End Date Taking? Authorizing Skylene Deremer  cetirizine (ZYRTEC) 10 MG tablet Take 10 mg by mouth daily.   Yes Historical Mariamawit Depaoli, MD  esomeprazole (NEXIUM) 40 MG  capsule Take 40 mg by mouth daily at 12 noon.   Yes Historical Rigo Letts, MD  lisinopril (PRINIVIL,ZESTRIL) 10 MG tablet Take 10 mg by mouth daily.   Yes Historical Rigley Niess, MD    Physical Exam: Filed Vitals:   03/22/13 2330 03/23/13 0000 03/23/13 0030 03/23/13 0128  BP: 124/75 115/69 123/66 158/89  Pulse: 60 66 58 71  Temp:    97.9 F (36.6 C)  TempSrc:    Oral  Resp: 15 19 12 16   Height:    6\' 4"  (1.93 m)  Weight:    148.417 kg (327 lb 3.2 oz)  SpO2: 98% 97% 97% 99%     General:  Well-developed well-nourished.  Eyes: Anicteric no pallor.  ENT: No discharge from ears eyes nose mouth.  Neck: No mass felt.  Cardiovascular: S1-S2 heard.  Respiratory: No rhonchi or crepitations.  Abdomen: Soft nontender bowel sounds present. No guarding no rigidity.  Skin: No rash.  Musculoskeletal: No edema.  Psychiatric: Appears normal.  Neurologic: Alert awake oriented to time place and person. Moves all extremities 5 x 5.  Labs on Admission:  Basic Metabolic Panel:  Recent Labs Lab 03/22/13 2125  NA 141  K 3.8  CL 105  CO2 27  GLUCOSE 115*  BUN 15  CREATININE 0.72  CALCIUM 8.8   Liver Function Tests: No results found for this basename: AST, ALT, ALKPHOS, BILITOT, PROT, ALBUMIN,  in the last 168 hours No results found for this basename: LIPASE, AMYLASE,  in the last 168 hours No results found for this basename: AMMONIA,  in the last 168 hours CBC:  Recent Labs  Lab 03/22/13 2125  WBC 16.2*  NEUTROABS 12.7*  HGB 12.6*  HCT 38.0*  MCV 89.6  PLT 276   Cardiac Enzymes: No results found for this basename: CKTOTAL, CKMB, CKMBINDEX, TROPONINI,  in the last 168 hours  BNP (last 3 results) No results found for this basename: PROBNP,  in the last 8760 hours CBG: No results found for this basename: GLUCAP,  in the last 168 hours  Radiological Exams on Admission: Dg Chest Portable 1 View  03/22/2013   CLINICAL DATA:  Left chest pain.  EXAM: PORTABLE CHEST - 1 VIEW   COMPARISON:  None.  FINDINGS: Portable view of the chest demonstrates low lung volumes. No evidence for edema or focal airspace disease. Heart size is within normal limits.  IMPRESSION: Low lung volumes without focal disease.   Electronically Signed   By: Richarda Overlie M.D.   On: 03/22/2013 21:15    EKG: Independently reviewed. Normal sinus rhythm with nonspecific T-wave changes in the lateral and inferior leads.  Assessment/Plan Principal Problem:   Chest pain Active Problems:   HTN (hypertension)   1. Chest pain - given the history of hypertension, former cigarette smoker, obesity and nonspecific T-wave changes patient has been admitted for further management. Presently chest pain-free on nitroglycerin patch. Aspirin. Cycle cardiac markers. 2. Hypertension - continue home medications. 3. Anemia - if there is no significant fall in hemoglobin then patient will need further workup as outpatient for his anemia.    Code Status: Full code.  Family Communication: None.  Disposition Plan: Admit for observation.    KAKRAKANDY,ARSHAD N. [facility] Pager [phone].  If 7PM-7AM, please contact night-coverage [email or web address] Password [email or web address] 03/23/2013, 1:47 AM

## 2013-03-23 NOTE — Interval H&P Note (Signed)
Cath Lab Visit (complete for each Cath Lab visit)  Clinical Evaluation Leading to the Procedure:   ACS: yes  Non-ACS:    Anginal Classification: CCS IV  Anti-ischemic medical therapy: No Therapy  Non-Invasive Test Results: No non-invasive testing performed  Prior CABG: No previous CABG      History and Physical Interval Note:  03/23/2013 12:41 PM  Steve Beck  has presented today for surgery, with the diagnosis of Chest pain  The various methods of treatment have been discussed with the patient and family. After consideration of risks, benefits and other options for treatment, the patient has consented to  Procedure(s): LEFT HEART CATHETERIZATION WITH CORONARY ANGIOGRAM (N/A) as a surgical intervention .  The patient's history has been reviewed, patient examined, no change in status, stable for surgery.  I have reviewed the patient's chart and labs.  Questions were answered to the patient's satisfaction.     Rocco Kerkhoff [employer]

## 2013-03-24 LAB — DRUGS OF ABUSE SCREEN W/O ALC, ROUTINE URINE
Amphetamine Screen, Ur: NEGATIVE
Benzodiazepines.: POSITIVE — AB
Cocaine Metabolites: NEGATIVE
Creatinine,U: 64.3 mg/dL
Opiate Screen, Urine: NEGATIVE
Phencyclidine (PCP): NEGATIVE
Propoxyphene: NEGATIVE

## 2013-03-24 LAB — BASIC METABOLIC PANEL
Calcium: 8.8 mg/dL (ref 8.4–10.5)
Chloride: 109 mEq/L (ref 96–112)
Creatinine, Ser: 0.67 mg/dL (ref 0.50–1.35)
GFR calc Af Amer: >90 mL/min (ref >90)
Potassium: 3.8 mEq/L (ref 3.5–5.1)

## 2013-03-24 LAB — CBC
HCT: 39.5 % (ref 39.0–52.0)
Hemoglobin: 12.8 g/dL — ABNORMAL LOW (ref 13.0–17.0)
Platelets: 295 10*3/uL (ref 150–400)
RDW: 14.6 % (ref 11.5–15.5)
WBC: 7.9 10*3/uL (ref 4.0–10.5)

## 2013-03-24 NOTE — Progress Notes (Signed)
Patient ID: Steve Beck, male   DOB: May 13, 1970, 42 y.o.   MRN: [id number] Subjective:  No chest pain or sob.   Objective:  Vital Signs in the last 24 hours: Temp:  [97.7 F (36.5 C)-98.8 F (37.1 C)] 97.7 F (36.5 C) (12/13 0412) Pulse Rate:  [64-85] 64 (12/13 0412) Resp:  [18] 18 (12/13 0412) BP: (135-152)/(69-87) 139/79 mmHg (12/13 0926) SpO2:  [96 %-99 %] 98 % (12/13 0412)  Intake/Output from previous day: 12/12 0701 - 12/13 0700 In: 240 [P.O.:240] Out: 150 [Urine:150] Intake/Output from this shift: Total I/O In: 240 [P.O.:240] Out: -   Physical Exam: Well appearing NAD HEENT: Unremarkable Neck:  No JVD, no thyromegally Lymphatics:  No adenopathy Back:  No CVA tenderness Lungs:  Clear HEART:  Regular rate rhythm, no murmurs, no rubs, no clicks Abd:  Flat, positive bowel sounds, no organomegally, no rebound, no guarding Ext:  2 plus pulses, no edema, no cyanosis, no clubbing Skin:  No rashes no nodules Neuro:  CN II through XII intact, motor grossly intact  Lab Results:  Recent Labs  03/23/13 0230 03/24/13 0440  WBC 11.1* 7.9  HGB 12.1* 12.8*  PLT 287 295    Recent Labs  03/23/13 0230 03/24/13 0440  NA 142 143  K 4.0 3.8  CL 109 109  CO2 24 27  GLUCOSE 118* 99  BUN 14 10  CREATININE 0.54 0.67    Recent Labs  03/23/13 0835 03/23/13 1455  TROPONINI 2.22* 3.26*   Hepatic Function Panel  Recent Labs  03/23/13 0230  PROT 6.5  ALBUMIN 3.8  AST 17  ALT 22  ALKPHOS 67  BILITOT 0.3    Recent Labs  03/23/13 0800  CHOL 107   No results found for this basename: PROTIME,  in the last 72 hours  Imaging: Ct Angio Chest Pe W/cm &/or Wo Cm  03/23/2013   CLINICAL DATA:  Acute onset left-sided chest pain radiating into the left arm yesterday. Cardiac catheterization earlier today demonstrated no coronary artery disease.  EXAM: CT ANGIOGRAPHY CHEST WITH CONTRAST  TECHNIQUE: Multidetector CT imaging of the chest was performed using the standard  protocol during bolus administration of intravenous contrast. Multiplanar CT image reconstructions including MIPs were obtained to evaluate the vascular anatomy.  CONTRAST:  80mL OMNIPAQUE IOHEXOL 350 MG/ML IV.  COMPARISON:  None.  FINDINGS: Contrast opacification of the pulmonary arteries is fair. No filling defects within either main pulmonary artery or their central branches in either lung to suggest pulmonary embolism. Heart size upper normal to borderline enlarged. Minimal LAD coronary calcification. No pericardial effusion. No visible atherosclerosis involving the thoracic or upper abdominal aorta or the proximal great vessels.  Minimal linear atelectasis deep in both lower lobes. Pulmonary parenchyma otherwise clear without localized airspace consolidation, interstitial disease, or parenchymal nodules or masses. Central airways patent without significant bronchial wall thickening. No pleural effusions.  No significant mediastinal, hilar, or axillary lymphadenopathy. Visualized thyroid gland unremarkable.  Vague approximate 1 cm cyst with Hounsfield measurements approximating 0 in the anterior segment right lobe of liver near the dome (series 4, images 66 and 67 and series 7, image 49). Visualized extreme upper abdomen otherwise unremarkable. Bone window images demonstrate minimal upper thoracic spondylosis.  Review of the MIP images confirms the above findings.  IMPRESSION: 1. No evidence of pulmonary embolism. 2. Minimal linear atelectasis deep in the lower lobes. No acute cardiopulmonary disease otherwise. 3. Possible minimal LAD coronary calcification.   Electronically Signed   By: Maisie Fus  Lawrence M.D.   On: 03/23/2013 16:20   Dg Chest Portable 1 View  03/22/2013   CLINICAL DATA:  Left chest pain.  EXAM: PORTABLE CHEST - 1 VIEW  COMPARISON:  None.  FINDINGS: Portable view of the chest demonstrates low lung volumes. No evidence for edema or focal airspace disease. Heart size is within normal limits.   IMPRESSION: Low lung volumes without focal disease.   Electronically Signed   By: Richarda Overlie M.D.   On: 03/22/2013 21:15    Cardiac Studies: Tele - nsr Assessment/Plan:  1.chest pain - s/p cath and ct angio ruling out PE and obstructive CAD. Ok for discharge. Followup Dr. Gwen Pounds in [place].   LOS: 2 days    Gregg Taylor,M.D. 03/24/2013, 10:18 AM

## 2013-03-24 NOTE — Discharge Summary (Signed)
Physician Discharge Summary  Steve Beck ZOX:[id number] DOB: 11-07-1970 DOA: 03/22/2013  PCP: No PCP Per Patient  Admit date: 03/22/2013 Discharge date: 03/24/2013  Time spent: 30 minutes  Recommendations for Outpatient Follow-up:  1. Follow up with PCP within 2 weeks of discharge.  Discharge Diagnoses:  Principal Problem:   Chest pain Active Problems:   HTN (hypertension)   Discharge Condition: good  Diet recommendation: regular  Filed Weights   03/22/13 2033 03/23/13 0128  Weight: 145.151 kg (320 lb) 148.417 kg (327 lb 3.2 oz)    History of present illness:  Steve Beck is a 42 y.o. male with history of hypertension and former cigarette smoker(smoked for 17 years quit 8 years ago) presents with complaints of chest pain. Patient states that he was at work when he started developing retrosternal pressure-like symptoms nonradiating but had diaphoresis and nausea. Denies any associated shortness of breath palpitations dizziness. In the ER EKG shows nonspecific T-wave changes in the lateral inferior leads. No old EKG to compare. Cardiac markers were negative. Patient's chest pain only subsided after placing nitroglycerin patch. Presently patient is chest pain-free and has been admitted for further management. Patient states that he had a similar episode in 2013 been he was admitted at [facility] and had undergone stress test which as per the patient was unremarkable   Hospital Course:  1) chest pain: patient was taken for cardiac catheterization due to concern of NSTEMI with elevated troponins, hypertension and obesity.  Cardiac catheterization showed no coronary artery disease.  He was then taken for CTA of chest to rule out PE and this too was negative.  Symptoms resolved during admission.  He was cleared by cardiology for discharge given negative test results.  He will follow up with his PCP within two weeks. He will call if symptoms recur.  2) hypertension:  blood pressures were fairly well controled in hospital on home regimen. He is discharged on lisinopril 10 mg.   Procedures: Cardiac catheterization 12/12: no obstructive coronary artery disease  Consultations:  cardiology  Discharge Exam: Filed Vitals:   03/24/13 0412  BP: 136/69  Pulse: 64  Temp: 97.7 F (36.5 C)  Resp: 18    General: no distress, alert cooperative Cardiovascular: RRR no mrg Respiratory: clear to ausculation with good air movement EXT: no edema, 2+ pulses, no excessive bleeding or bruising at the site of catheter placement in right arm  Discharge Instructions  Discharge Orders   Future Orders Complete By Expires   Call MD for:  difficulty breathing, headache or visual disturbances  As directed    Call MD for:  severe uncontrolled pain  As directed    Diet - low sodium heart healthy  As directed    Discharge instructions  As directed    Comments:     Take this opportunity to improve your health.  Increase exercise, improve diet, lose weight.  Please follow up with your primary care provider.   Increase activity slowly  As directed        Medication List         cetirizine 10 MG tablet  Commonly known as:  ZYRTEC  Take 10 mg by mouth daily.     esomeprazole 40 MG capsule  Commonly known as:  NEXIUM  Take 40 mg by mouth daily at 12 noon.     lisinopril 10 MG tablet  Commonly known as:  PRINIVIL,ZESTRIL  Take 10 mg by mouth daily.  No Known Allergies     Follow-up Information   Follow up with Clydell Hakim, MD In 2 weeks.   Specialty:  Family Medicine   Contact information:   [address] [place] [address] [phone]        The results of significant diagnostics from this hospitalization (including imaging, microbiology, ancillary and laboratory) are listed below for reference.    Significant Diagnostic Studies: Ct Angio Chest Pe W/cm &/or Wo Cm  03/23/2013   CLINICAL DATA:  Acute onset  left-sided chest pain radiating into the left arm yesterday. Cardiac catheterization earlier today demonstrated no coronary artery disease.  EXAM: CT ANGIOGRAPHY CHEST WITH CONTRAST  TECHNIQUE: Multidetector CT imaging of the chest was performed using the standard protocol during bolus administration of intravenous contrast. Multiplanar CT image reconstructions including MIPs were obtained to evaluate the vascular anatomy.  CONTRAST:  80mL OMNIPAQUE IOHEXOL 350 MG/ML IV.  COMPARISON:  None.  FINDINGS: Contrast opacification of the pulmonary arteries is fair. No filling defects within either main pulmonary artery or their central branches in either lung to suggest pulmonary embolism. Heart size upper normal to borderline enlarged. Minimal LAD coronary calcification. No pericardial effusion. No visible atherosclerosis involving the thoracic or upper abdominal aorta or the proximal great vessels.  Minimal linear atelectasis deep in both lower lobes. Pulmonary parenchyma otherwise clear without localized airspace consolidation, interstitial disease, or parenchymal nodules or masses. Central airways patent without significant bronchial wall thickening. No pleural effusions.  No significant mediastinal, hilar, or axillary lymphadenopathy. Visualized thyroid gland unremarkable.  Vague approximate 1 cm cyst with Hounsfield measurements approximating 0 in the anterior segment right lobe of liver near the dome (series 4, images 66 and 67 and series 7, image 49). Visualized extreme upper abdomen otherwise unremarkable. Bone window images demonstrate minimal upper thoracic spondylosis.  Review of the MIP images confirms the above findings.  IMPRESSION: 1. No evidence of pulmonary embolism. 2. Minimal linear atelectasis deep in the lower lobes. No acute cardiopulmonary disease otherwise. 3. Possible minimal LAD coronary calcification.   Electronically Signed   By: Hulan Saas M.D.   On: 03/23/2013 16:20   Dg Chest  Portable 1 View  03/22/2013   CLINICAL DATA:  Left chest pain.  EXAM: PORTABLE CHEST - 1 VIEW  COMPARISON:  None.  FINDINGS: Portable view of the chest demonstrates low lung volumes. No evidence for edema or focal airspace disease. Heart size is within normal limits.  IMPRESSION: Low lung volumes without focal disease.   Electronically Signed   By: Richarda Overlie M.D.   On: 03/22/2013 21:15    Microbiology: No results found for this or any previous visit (from the past 240 hour(s)).   Labs: Basic Metabolic Panel:  Recent Labs Lab 03/22/13 2125 03/23/13 0230 03/24/13 0440  NA 141 142 143  K 3.8 4.0 3.8  CL 105 109 109  CO2 27 24 27   GLUCOSE 115* 118* 99  BUN 15 14 10   CREATININE 0.72 0.54 0.67  CALCIUM 8.8 8.6 8.8   Liver Function Tests:  Recent Labs Lab 03/23/13 0230  AST 17  ALT 22  ALKPHOS 67  BILITOT 0.3  PROT 6.5  ALBUMIN 3.8   No results found for this basename: LIPASE, AMYLASE,  in the last 168 hours No results found for this basename: AMMONIA,  in the last 168 hours CBC:  Recent Labs Lab 03/22/13 2125 03/23/13 0230 03/24/13 0440  WBC 16.2* 11.1* 7.9  NEUTROABS 12.7* 9.3*  --  HGB 12.6* 12.1* 12.8*  HCT 38.0* 37.7* 39.5  MCV 89.6 89.8 90.4  PLT 276 287 295   Cardiac Enzymes:  Recent Labs Lab 03/23/13 0230 03/23/13 0835 03/23/13 1455  TROPONINI 0.32* 2.22* 3.26*   BNP: BNP (last 3 results) No results found for this basename: PROBNP,  in the last 8760 hours CBG: No results found for this basename: GLUCAP,  in the last 168 hours     Signed:  Jenne Sellinger  [facility] 03/24/2013, 7:58 AM

## 2013-03-24 NOTE — Progress Notes (Signed)
Discharge instructions along with med list provided. Right radial intact with no bleeding or hematoma noted. Post activity and precautions explained. Iv d/c'd with catheter intact. Patient transported to main lobby via wheelchair for discharge home. Belongings with patient and family.Mamie Levers

## 2013-03-28 LAB — BENZODIAZEPINE, QUANTITATIVE, URINE
Alprazolam metabolite (GC/LC/MS), ur confirm: NEGATIVE ng/mL
Estazolam (GC/LC/MS), ur confirm: NEGATIVE ng/mL
Flurazepam GC/MS Conf: NEGATIVE ng/mL
Oxazepam GC/MS Conf: NEGATIVE ng/mL
Temazepam GC/MS Conf: NEGATIVE ng/mL
Triazolam metabolite (GC/LC/MS), ur confirm: NEGATIVE ng/mL

## 2013-09-28 ENCOUNTER — Ambulatory Visit: Payer: Self-pay | Admitting: Gastroenterology

## 2013-10-02 LAB — PATHOLOGY REPORT

## 2013-10-17 DIAGNOSIS — K5090 Crohn's disease, unspecified, without complications: Secondary | ICD-10-CM | POA: Insufficient documentation

## 2014-03-21 ENCOUNTER — Encounter (HOSPITAL_COMMUNITY): Payer: Self-pay | Admitting: Cardiology

## 2014-08-04 NOTE — Discharge Summary (Signed)
PATIENT NAME:  Steve Beck, Steve Beck MR#:  [id number] DATE OF BIRTH:  17-Apr-1970  DATE OF ADMISSION:  05/07/2011 DATE OF DISCHARGE:  05/07/2011  ADMITTING DIAGNOSIS: Chest pain.   DISCHARGE DIAGNOSES:  1. Chest pain, felt to be atypical per Cardiology. The patient will have a stress test done as an outpatient with Dr. Saralyn Pilar.  2. Hypertension.  3. Irritable bowel syndrome.  4. Hypernatremia possibly due to volume depletion.  PERTINENT LABORATORY, DIAGNOSTIC AND RADIOLOGICAL DATA:  BMP: Glucose was 152, BUN 10, creatinine 0.62, sodium 149, potassium 3.5, chloride 109, CO2 26, calcium 8.7.  LFTs were normal.  Troponin x2 less than 0.02.  WBC 8.0, hemoglobin 13.3, platelet count was 261.  EKG showed normal sinus rhythm, nonspecific T wave changes.   CONSULTANTS: Dr. Saralyn Pilar.   HISTORY AND PHYSICAL: Please see History and Physical done by the admitting physician.   HOSPITAL COURSE: The patient is a 44 year old white male with history of hypertension, presented with complaint of chest tightness and left arm pain. His symptoms lasted about an hour. The patient reports that he has had symptoms of left arm pain unrelated to activity in the past, but this was different. Due to these symptoms, he came to the ED. In the ER, his evaluation included cardiac enzymes and EKG which were nonrevealing. The patient was admitted for further evaluation. He had subsequent sets of cardiac enzymes which remained negative. He was seen in consultation by Dr. Saralyn Pilar, who recommended an outpatient stress test. At this time, the patient reports that he is very anxious to go home and is stable for discharge.     DISCHARGE MEDICATIONS:  1. Zyrtec 10 mg daily.  2. Lisinopril 10 mg p.o. daily.  3. Atenolol 50 mg daily.  4. Zegerid over-the-counter 20/1100, 1 tab p.o. daily.  5. Aspirin 81 mg 1 tab p.o. daily.   DIET: Low sodium diet.   ACTIVITY: As tolerated.   TIMEFRAME FOR FOLLOW-UP:  1. Follow up in 1 to 2  weeks with primary physician.   2. Follow up with Dr. Saralyn Pilar next week for outpatient stress test.   TIME SPENT:   32 minutes.   ____________________________ Lafonda Mosses Posey Pronto, MD shp:cbb D: 05/07/2011 16:25:17 ET T: 05/08/2011 10:04:42 ET JOB#: [id number]  cc: Jasmaine Rochel H. Posey Pronto, MD, <Dictator> Alric Seton MD ELECTRONICALLY SIGNED 05/29/2011 9:58

## 2014-08-04 NOTE — H&P (Signed)
PATIENT NAME:  Steve Beck, HILGERT MR#:  [id number] DATE OF BIRTH:  11-Jan-1971  DATE OF ADMISSION:  05/07/2011  EMERGENCY ROOM PHYSICIAN: Dr. Beather Arbour   CHIEF COMPLAINT: Chest pain.   HISTORY OF PRESENT ILLNESS: Patient is a 44 year old male presents with chief complaint of substernal chest pain described as chest tightness. Symptoms began about 10:00 at night while patient was watching television. Chest pain lasted for about one hour, associated with left arm radiation. Patient tried to get up and got short of breath. He denies any dizziness, diaphoresis. Denies any palpitations. He denies any other aggravating or alleviating factors.   ALLERGIES: No known drug allergies.   PAST MEDICAL HISTORY:  1. Irritable bowel syndrome. 2. Hypertension.   CURRENT MEDICATIONS:  1. Zyrtec 10 mg p.o.  2. Zegerid 20/1100 mg p.o. daily.  3. Lisinopril 10 mg p.o. daily.  4. Atenolol 50 mg p.o. daily.   SOCIAL HISTORY: Patient denies tobacco abuse, alcohol abuse, or drug abuse. He quit smoking six years ago.   FAMILY HISTORY: Patient's mother is in her 19s, has hypertension. Family history positive for MI. Patient had a stress test three months ago which was negative.   REVIEW OF SYSTEMS: CONSTITUTIONAL: Patient denied any fevers, chills, night sweats. HEENT: Patient denies any hearing loss, dysphagia, visual problems, sore throat. CARDIOVASCULAR: Patient denies any orthopnea, PND, syncope. RESPIRATORY: Patient denies any cough, wheezing, or hemoptysis. GASTROINTESTINAL: Patient denies any nausea, vomiting, abdominal pain, hematemesis, hematochezia, melena. GENITOURINARY: Patient denies any hematuria, dysuria, frequency. NEUROLOGIC: Patient denies any headache, focal weakness or seizures. SKIN: Patient denies any lesions, rash. ENDOCRINE: Patient denies polyuria, polyphagia, polydipsia. MUSCULOSKELETAL: Patient denies any arthritis, joint effusion, swelling. HEMATOLOGIC: Patient denies any easy bleeding or bruises.    PHYSICAL EXAMINATION:  VITAL SIGNS: Temperature 97.2, heart rate 70, respiratory rate 20, blood pressure 147/80, oxygen sats 98%.   HEENT: Atraumatic, normocephalic. Pupils are equal, round, and reactive to light and accommodation. Extraocular movements are intact. Sclera is anicteric. Mucous membranes moist.   NECK: Supple. No organomegaly.   CARDIOVASCULAR: S1, S2, regular rate, rhythm. No gallops. No thrills. No murmurs.   RESPIRATORY: Lungs are clear to auscultation. No rales, no rhonchi, no wheezes, no bronchial breath sounds.   GASTROINTESTINAL: Abdomen is soft, nontender, nondistended. Normal bowel sounds. No hepatosplenomegaly.   GENITOURINARY: There is no hematuria or masses noted.   SKIN: No lesion. No rash.   ENDOCRINE: No masses, no thyromegaly noted.   LYMPH: No lymphadenopathy or nodes palpable.   NEUROLOGIC: Cranial nerves II through XII grossly intact. Motor strength is 5/5 bilateral upper and lower extremities. Sensation is within normal limits. No focal neurological deficits noted on examination.   LABORATORY, DIAGNOSTIC AND RADIOLOGICAL DATA:  EKG: Normal sinus rhythm, 78 beats per minute, prolonged QT, nonspecific T wave abnormalities.   Glucose 152, BUN 10, creatinine 0.62, sodium 149, potassium 0.5, chloride 99, CO2 26, calcium 8.7, total bilirubin 0.2, alkaline phosphatase 57, ALT 37, AST 20, total protein 6.7, albumin 3.7. Estimated GFR greater than 60. WBC count 8000, hemoglobin 15.3, hematocrit 40.1, platelet count 261, troponin less than 0.02.   ASSESSMENT AND PLAN:  1. Patient is a 44 year old male presents with chief complaint of chest pain. Patient reported have a negative stress test three months ago. Will admit patient to telemetry unit. Start patient on aspirin. Continue atenolol. Check serial cardiac enzymes, troponin, echo. Cardiology consultation.  2. Hypertension. Continue lisinopril. 3. Hypernatremia due to dehydration. Start IV fluids. Monitor  sodium levels closely.  4.  Possible sleep apnea. Check sleep study as an outpatient.  5. Morbid obesity. Lifestyle modification, diet, exercise.    ____________________________ Tyrone Schimke, MD jsp:cms D: 05/07/2011 02:05:46 ET T: 05/07/2011 06:36:50 ET JOB#: [id number]  cc: Tyrone Schimke, MD, <Dictator> Tyrone Schimke MD ELECTRONICALLY SIGNED 05/07/2011 22:42

## 2014-08-04 NOTE — Consult Note (Signed)
Brief Consult Note: Diagnosis: CP, neg trop, no recuurence.   Patient was seen by consultant.   Consult note dictated.   Comments: REC  Agree with current therapy, defer full dose anticoagulation, ETT-sest, can be done as out-patient.  Electronic Signatures: Isaias Cowman (MD)  (Signed 25-Jan-13 15:01)  Authored: Brief Consult Note   Last Updated: 25-Jan-13 15:01 by Isaias Cowman (MD)

## 2014-08-04 NOTE — Consult Note (Signed)
PATIENT NAME:  Steve Beck, Steve Beck MR#:  [id number] DATE OF BIRTH:  02/27/1971  DATE OF CONSULTATION:  05/07/2011  REFERRING PHYSICIAN:   CONSULTING PHYSICIAN:  Isaias Cowman, MD  PRIMARY CARE PHYSICIAN: [facility].  CHIEF COMPLAINT: Chest pain.  HISTORY OF PRESENT ILLNESS: The patient is a 44 year old gentleman referred for evaluation of chest pain. The patient reports that he was in his usual state of health until yesterday when he experienced an episode of substernal chest discomfort with radiation to his left arm. This episode of chest discomfort occurred while he was watching television. It lasted approximately one hour. The patient was admitted to telemetry where he has had no recurrence of chest pain. He has ruled out for myocardial infarction by CPK isoenzymes and troponin. EKG was nondiagnostic.   PAST MEDICAL HISTORY:  1. Hypertension. 2. Irritable bowel syndrome.  MEDICATIONS:  1. Lisinopril 10 mg daily.  2. Atenolol 50 mg daily.  3. Zyrtec 10 mg daily.  4. Zegerid 20 mg daily.   SOCIAL HISTORY: The patient is married. He quit tobacco abuse six years ago.   FAMILY HISTORY: No immediate family history of coronary artery disease.   REVIEW OF SYSTEMS: CONSTITUTIONAL: No fever or chills. EYES: No vision loss. EARS: No hearing loss. RESPIRATORY: No shortness of breath. CARDIOVASCULAR: Chest pain, as described above. GASTROINTESTINAL: The patient denies nausea, vomiting, diarrhea, and constipation. GU: The patient denies dysuria and hematuria. ENDOCRINE: The patient denies polyuria or polydipsia. MUSCULOSKELETAL: The patient denies arthralgias or myalgias. NEUROLOGICAL: The patient denies focal muscle weakness or numbness.   PHYSICAL EXAMINATION:   VITAL SIGNS: Blood pressure 109/67, pulse 64, respirations 20, temperature 97.3, pulse oximetry 94%.   HEENT: Pupils are equal and reactive to light and accommodation.   NECK: Supple without thyromegaly.   LUNGS:  Clear.   HEART: Normal jugular venous pressure. Normal point of maximal impulse. Regular rate and rhythm. Normal S1 and S2. No appreciable gallop, murmur, or rub.   ABDOMEN: Soft and nontender. Pulses were intact bilaterally.   MUSCULOSKELETAL: Normal muscle tone.   NEUROLOGIC: The patient is alert and oriented x3. Motor and sensory are both grossly intact.   IMPRESSION: This is a 44 year old gentleman with history of hypertension who presents with one hour episode of chest pain and has ruled out for myocardial infarction by CPK isoenzymes and troponin.   RECOMMENDATIONS:  1. Agree with current therapy.  2. Would defer full dose anticoagulation.  3. Proceed with ETT sestamibi study. This could be done as an outpatient. ____________________________ Isaias Cowman, MD ap:slb D: 05/07/2011 15:00:30 ET    T: 05/07/2011 15:27:58 ET       JOB#: [id number] Sheppard Coil Zitlali Primm MD ELECTRONICALLY SIGNED 05/28/2011 10:57

## 2015-03-02 IMAGING — CT CT ANGIO CHEST
2 of 9 series · 18 of 46 positions shown · IV contrast (omnipaque)
Comparison: None.

CLINICAL DATA: Acute onset left-sided chest pain radiating into the
left arm yesterday. Cardiac catheterization earlier today
demonstrated no coronary artery disease.

EXAM:
CT ANGIOGRAPHY CHEST WITH CONTRAST
TECHNIQUE: Multidetector CT imaging of the chest was performed using the
standard protocol during bolus administration of intravenous
contrast. Multiplanar CT image reconstructions including MIPs were
obtained to evaluate the vascular anatomy.
CONTRAST:  80mL OMNIPAQUE IOHEXOL 350 MG/ML IV.

[Series 5: thins · axial · 0.79mm/px · z∈[-708,-474]mm · 15 of 265 slices shown]
[im 15/265  lung]
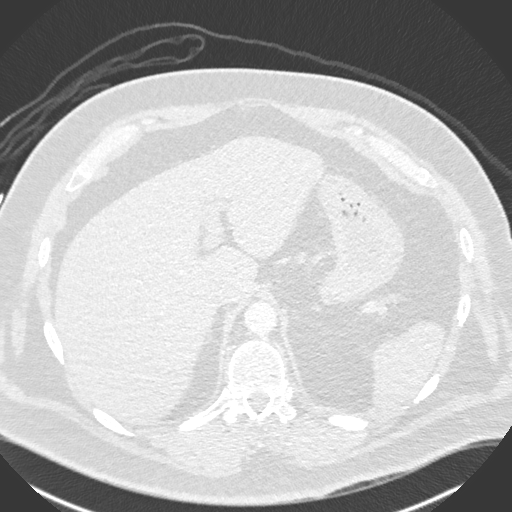
[im 30/265  soft-tissue]
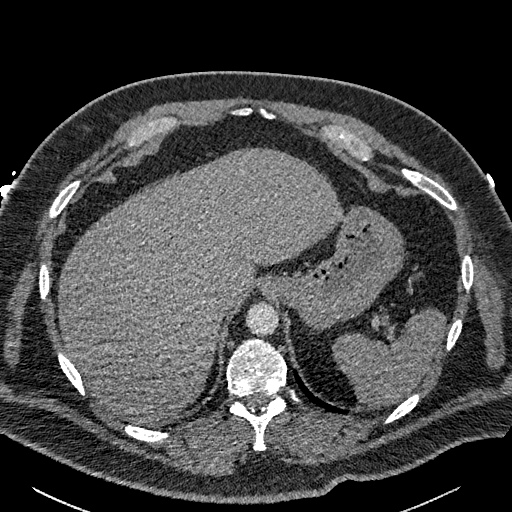
[im 45/265  lung]
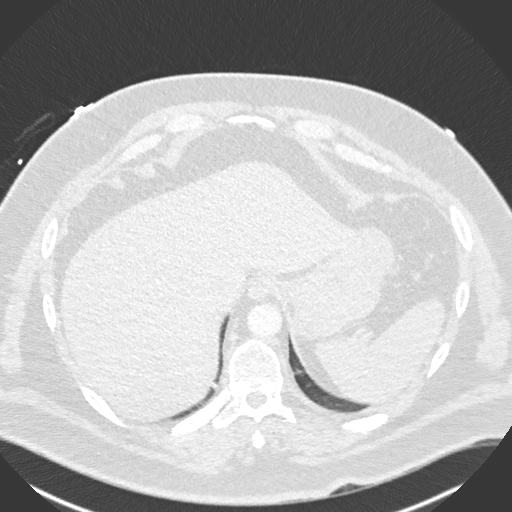
[im 59/265  soft-tissue]
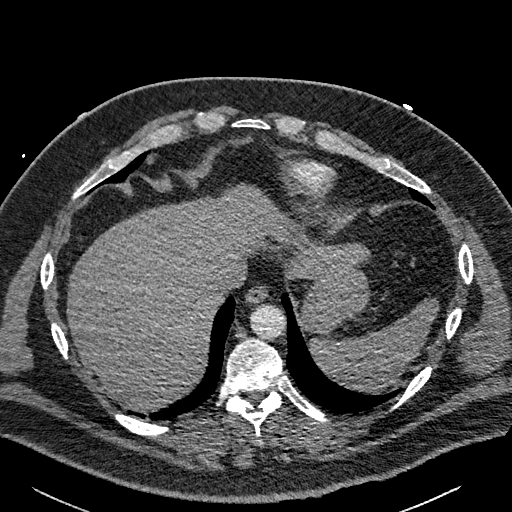
[im 89/265  lung]
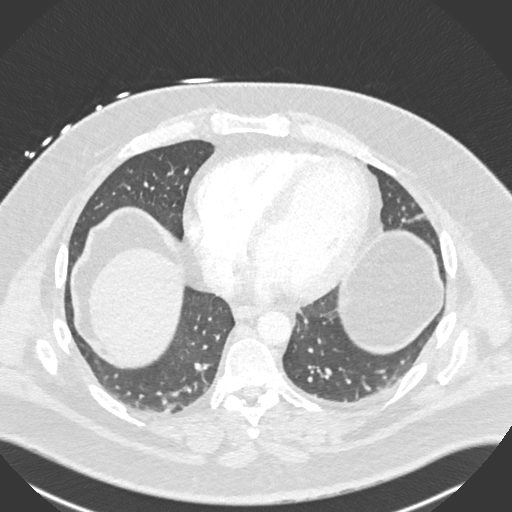
[im 103/265  soft-tissue]
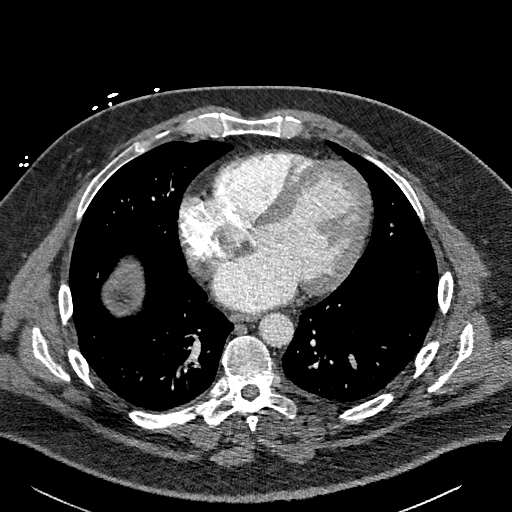
[im 118/265  lung]
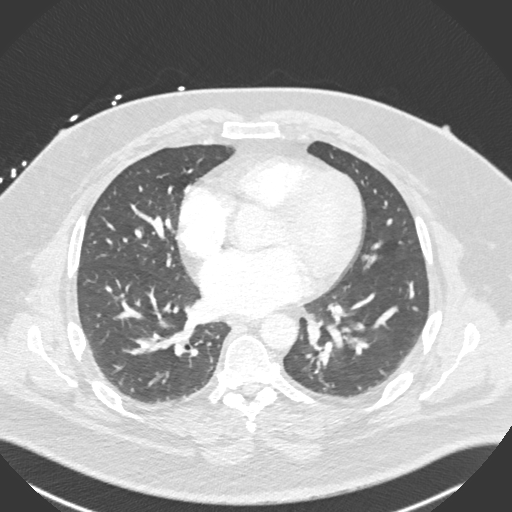
[im 133/265  soft-tissue]
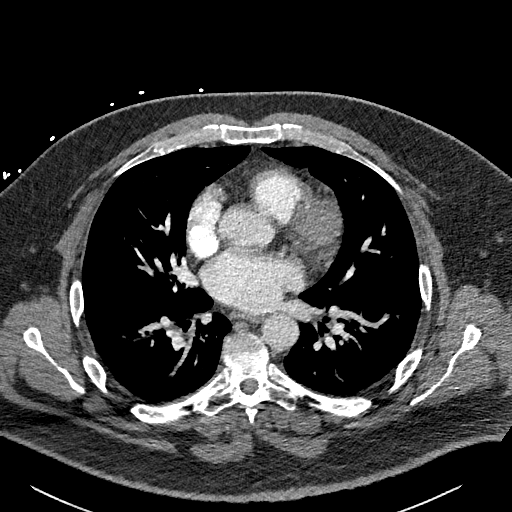
[im 147/265  lung]
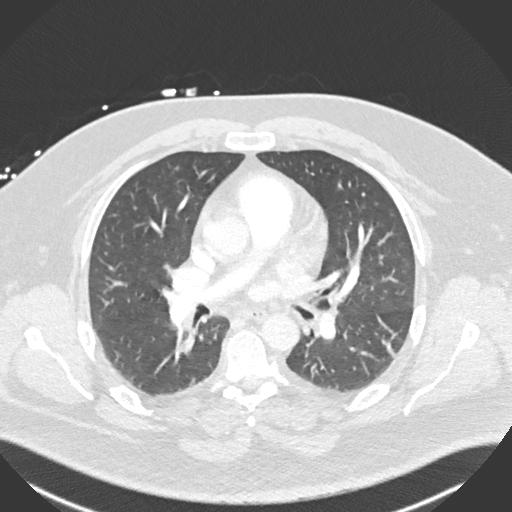
[im 162/265  soft-tissue]
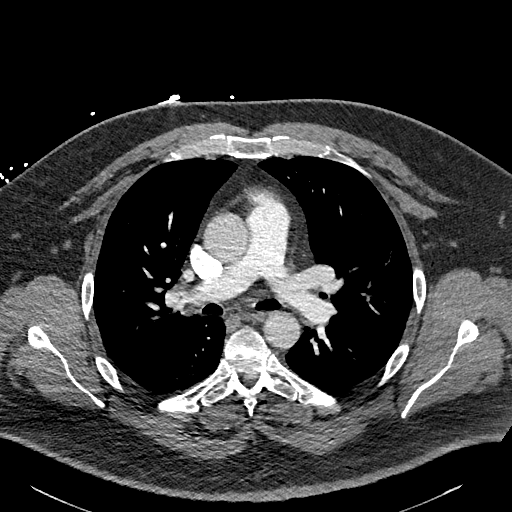
[im 177/265  lung]
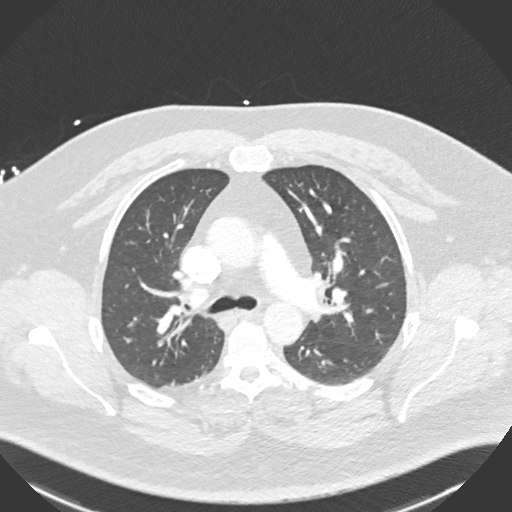
[im 206/265  soft-tissue]
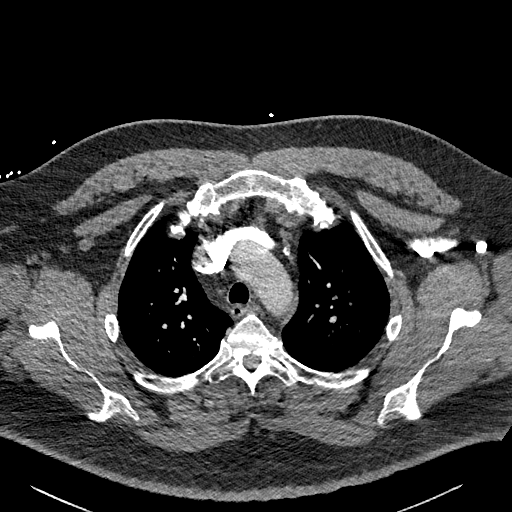
[im 221/265  lung]
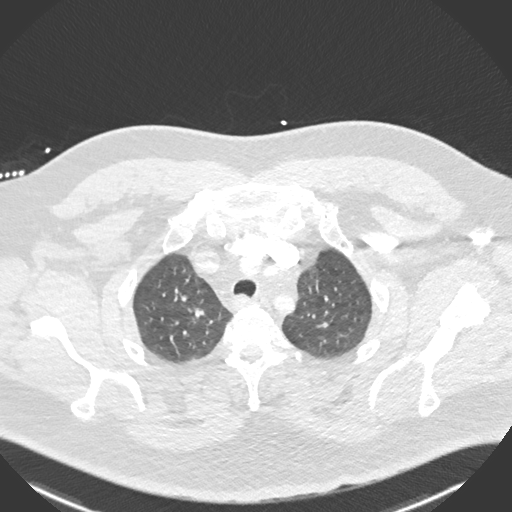
[im 235/265  soft-tissue]
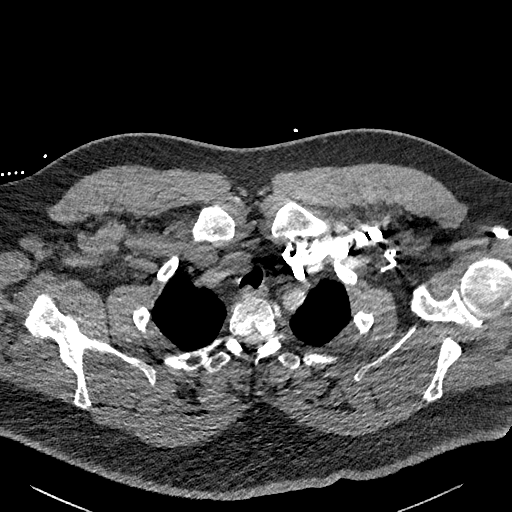
[im 250/265  lung]
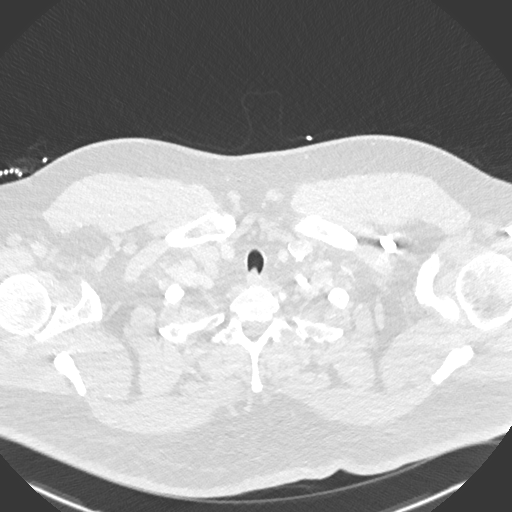

[Series 7: coronal mpr · coronal · 0.61mm/px · 3 of 125 slices shown]
[im 32/125  soft-tissue]
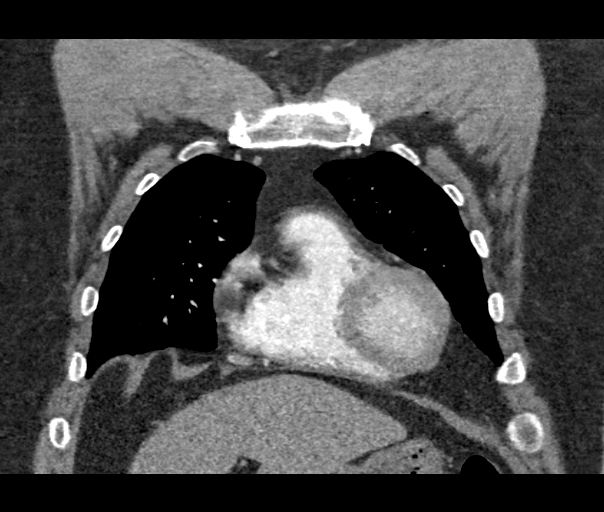
[im 63/125  soft-tissue]
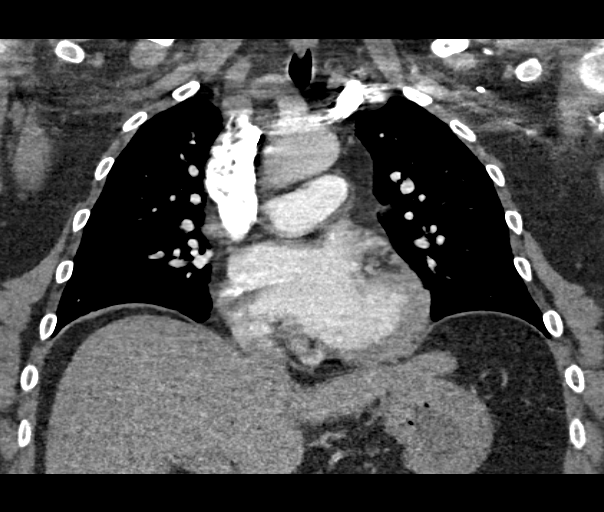
[im 94/125  soft-tissue]
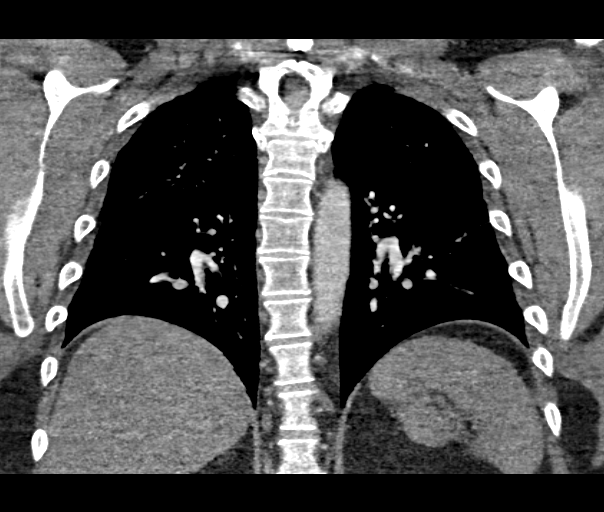

[18 of 46 positions shown; findings below may reference images not displayed]

FINDINGS: Contrast opacification of the pulmonary arteries is fair. No filling
defects within either main pulmonary artery or their central
branches in either lung to suggest pulmonary embolism. Heart size
upper normal to borderline enlarged. Minimal LAD coronary
calcification. No pericardial effusion. No visible atherosclerosis
involving the thoracic or upper abdominal aorta or the proximal
great vessels.

Minimal linear atelectasis deep in both lower lobes. Pulmonary
parenchyma otherwise clear without localized airspace consolidation,
interstitial disease, or parenchymal nodules or masses. Central
airways patent without significant bronchial wall thickening. No
pleural effusions.

No significant mediastinal, hilar, or axillary lymphadenopathy.
Visualized thyroid gland unremarkable.

Vague approximate 1 cm cyst with Hounsfield measurements
approximating 0 in the anterior segment right lobe of liver near the
dome (series 4, images 66 and 67 and series 7, image 49). Visualized
extreme upper abdomen otherwise unremarkable. Bone window images
demonstrate minimal upper thoracic spondylosis.

Review of the MIP images confirms the above findings.
IMPRESSION: 1. No evidence of pulmonary embolism.
2. Minimal linear atelectasis deep in the lower lobes. No acute
cardiopulmonary disease otherwise.
3. Possible minimal LAD coronary calcification.

## 2015-03-14 ENCOUNTER — Encounter: Payer: Self-pay | Admitting: *Deleted

## 2015-03-17 ENCOUNTER — Encounter: Payer: Self-pay | Admitting: *Deleted

## 2015-03-17 ENCOUNTER — Ambulatory Visit: Payer: Managed Care, Other (non HMO) | Admitting: Anesthesiology

## 2015-03-17 ENCOUNTER — Encounter
Admission: RE | Disposition: A | Discharge status: Home / Self Care | Payer: Self-pay | Source: Ambulatory Visit | Attending: Gastroenterology

## 2015-03-17 ENCOUNTER — Ambulatory Visit
Admission: RE | Admit: 2015-03-17 | Discharge: 2015-03-17 | Disposition: A | Discharge status: Home / Self Care | Payer: Managed Care, Other (non HMO) | Source: Ambulatory Visit | Attending: Gastroenterology | Admitting: Gastroenterology

## 2015-03-17 DIAGNOSIS — K5000 Crohn's disease of small intestine without complications: Secondary | ICD-10-CM | POA: Diagnosis not present

## 2015-03-17 DIAGNOSIS — D175 Benign lipomatous neoplasm of intra-abdominal organs: Secondary | ICD-10-CM | POA: Insufficient documentation

## 2015-03-17 DIAGNOSIS — K589 Irritable bowel syndrome without diarrhea: Secondary | ICD-10-CM | POA: Insufficient documentation

## 2015-03-17 DIAGNOSIS — K219 Gastro-esophageal reflux disease without esophagitis: Secondary | ICD-10-CM | POA: Insufficient documentation

## 2015-03-17 DIAGNOSIS — J309 Allergic rhinitis, unspecified: Secondary | ICD-10-CM | POA: Insufficient documentation

## 2015-03-17 DIAGNOSIS — Z881 Allergy status to other antibiotic agents status: Secondary | ICD-10-CM | POA: Insufficient documentation

## 2015-03-17 DIAGNOSIS — K5730 Diverticulosis of large intestine without perforation or abscess without bleeding: Secondary | ICD-10-CM | POA: Diagnosis not present

## 2015-03-17 DIAGNOSIS — Z87891 Personal history of nicotine dependence: Secondary | ICD-10-CM | POA: Insufficient documentation

## 2015-03-17 DIAGNOSIS — Z800 Family history of malignant neoplasm of digestive organs: Secondary | ICD-10-CM | POA: Diagnosis not present

## 2015-03-17 DIAGNOSIS — Z8249 Family history of ischemic heart disease and other diseases of the circulatory system: Secondary | ICD-10-CM | POA: Diagnosis not present

## 2015-03-17 DIAGNOSIS — Z87442 Personal history of urinary calculi: Secondary | ICD-10-CM | POA: Diagnosis not present

## 2015-03-17 DIAGNOSIS — Z79899 Other long term (current) drug therapy: Secondary | ICD-10-CM | POA: Diagnosis not present

## 2015-03-17 DIAGNOSIS — I10 Essential (primary) hypertension: Secondary | ICD-10-CM | POA: Diagnosis not present

## 2015-03-17 DIAGNOSIS — E669 Obesity, unspecified: Secondary | ICD-10-CM | POA: Insufficient documentation

## 2015-03-17 DIAGNOSIS — Z6837 Body mass index (BMI) 37.0-37.9, adult: Secondary | ICD-10-CM | POA: Insufficient documentation

## 2015-03-17 DIAGNOSIS — Z8371 Family history of colonic polyps: Secondary | ICD-10-CM | POA: Insufficient documentation

## 2015-03-17 HISTORY — PX: COLONOSCOPY WITH PROPOFOL: SHX5780

## 2015-03-17 HISTORY — DX: Crohn's disease, unspecified, without complications: K50.90

## 2015-03-17 SURGERY — COLONOSCOPY WITH PROPOFOL
Anesthesia: General

## 2015-03-17 MED ORDER — SODIUM CHLORIDE 0.9 % IV SOLN
INTRAVENOUS | Status: DC
  Administered 2015-03-17: 1000 mL via INTRAVENOUS

## 2015-03-17 MED ORDER — PROPOFOL 500 MG/50ML IV EMUL
INTRAVENOUS | Status: DC | PRN
  Administered 2015-03-17: 120 ug/kg/min via INTRAVENOUS

## 2015-03-17 MED ORDER — PROPOFOL 10 MG/ML IV BOLUS
INTRAVENOUS | Status: DC | PRN
  Administered 2015-03-17: 20 mg via INTRAVENOUS
  Administered 2015-03-17: 100 mg via INTRAVENOUS

## 2015-03-17 MED ORDER — LIDOCAINE HCL (CARDIAC) 20 MG/ML IV SOLN
INTRAVENOUS | Status: DC | PRN
  Administered 2015-03-17: 50 mg via INTRAVENOUS

## 2015-03-17 NOTE — H&P (Signed)
Primary Care Physician:  Vernie Murders, PA  Pre-Procedure History & Physical: HPI:  Steve Beck is a 44 y.o. male is here for an colonoscopy.   Past Medical History  Diagnosis Date  . GERD (gastroesophageal reflux disease)   . Hypertension   . Obesity   . IBS (irritable bowel syndrome)   . Crohn's disease [facility])     Past Surgical History  Procedure Laterality Date  . None    . Left heart catheterization with coronary angiogram N/A 03/23/2013    Procedure: LEFT HEART CATHETERIZATION WITH CORONARY ANGIOGRAM;  Surgeon: Larey Dresser, MD;  Location: [facility] CATH LAB;  Service: Cardiovascular;  Laterality: N/A;  . Colonoscopy      Prior to Admission medications   Medication Sig Start Date End Date Taking? Authorizing Provider  Ascorbic Acid (VITAMIN C) 100 MG tablet Take 100 mg by mouth daily.   Yes Historical Provider, MD  azaTHIOprine (IMURAN) 50 MG tablet Take 100 mg by mouth daily.   Yes Historical Provider, MD  fluticasone (FLONASE) 50 MCG/ACT nasal spray Place 2 sprays into both nostrils daily.   Yes Historical Provider, MD  lisinopril (PRINIVIL,ZESTRIL) 10 MG tablet Take 10 mg by mouth daily.   Yes Historical Provider, MD  Omeprazole-Sodium Bicarbonate (ZEGERID) 20-1100 MG CAPS capsule Take 1 capsule by mouth daily before breakfast.   Yes Historical Provider, MD  vitamin B-12 (CYANOCOBALAMIN) 1000 MCG tablet Take 1,000 mcg by mouth daily.   Yes Historical Provider, MD  cetirizine (ZYRTEC) 10 MG tablet Take 10 mg by mouth daily.    Historical Provider, MD  esomeprazole (NEXIUM) 40 MG capsule Take 40 mg by mouth daily at 12 noon.    Historical Provider, MD    Allergies as of 02/18/2015  . (No Known Allergies)    Family History  Problem Relation Age of Onset  . Esophageal cancer Father   . Diabetes Mellitus II Other   . Pulmonary embolism Father   . Hypertension Mother     H/o heart murmur but no CAD  . Migraines Mother     Social History   Social History  .  Marital Status: Legally Separated    Spouse Name: N/A  . Number of Children: N/A  . Years of Education: N/A   Occupational History  .      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   Social History Main Topics  . Smoking status: Former Research scientist (life sciences)  . Smokeless tobacco: Not on file     Comment: Smoked for 17 years - quit ~2006  . Alcohol Use: No  . Drug Use: No  . Sexual Activity: Not on file   Other Topics Concern  . Not on file   Social History Narrative     Physical Exam: BP 130/67 mmHg  Pulse 65  Temp(Src) 97.9 F (36.6 C) (Oral)  Resp 17  Ht 6\' 6"  (1.981 m)  Wt 147.873 kg (326 lb)  BMI 37.68 kg/m2  SpO2 97% General:   Alert,  pleasant and cooperative in NAD Head:  Normocephalic and atraumatic. Neck:  Supple; no masses or thyromegaly. Lungs:  Clear throughout to auscultation.    Heart:  Regular rate and rhythm. Abdomen:  Soft, nontender and nondistended. Normal bowel sounds, without guarding, and without rebound.   Neurologic:  Alert and  oriented x4;  grossly normal neurologically.  Impression/Plan: Steve Beck is here for an colonoscopy to be performed for Crohn's disease staging and assess response to therapy.   Risks,  benefits, limitations, and alternatives regarding  colonoscopy have been reviewed with the patient.  Questions have been answered.  All parties agreeable.   Josefine Class, MD  03/17/2015, 11:47 AM

## 2015-03-17 NOTE — Transfer of Care (Signed)
Immediate Anesthesia Transfer of Care Note  Patient: Steve Beck  Procedure(s) Performed: Procedure(s): COLONOSCOPY WITH PROPOFOL (N/A)  Patient Location: Endoscopy Unit  Anesthesia Type:General  Level of Consciousness: awake  Airway & Oxygen Therapy: Patient Spontanous Breathing and Patient connected to nasal cannula oxygen  Post-op Assessment: Report given to RN  Post vital signs: Reviewed  Last Vitals:  Filed Vitals:   03/17/15 1041 03/17/15 1215  BP: 130/67 114/66  Pulse: 65 70  Temp: 36.6 C 36.1 C  Resp: 17 18    Complications: No apparent anesthesia complications

## 2015-03-17 NOTE — Op Note (Signed)
[facility] Gastroenterology Patient Name: Steve Beck Procedure Date: 03/17/2015 11:49 AM MRN: [id number] Account #: [id number] Date of Birth: 24-Dec-1970 Admit Type: Outpatient Age: 44 Room: [facility] ENDO ROOM 3 Gender: Male Note Status: Finalized Procedure:         Colonoscopy Indications:       Disease activity assessment of Crohn's disease of the                     small bowel and colon, , Assess therapeutic response to                     therapy of Crohn's disease of the small bowel and                     colon(currently on 6-MP 100 mg daily with no symptoms) Patient Profile:   This is a 44 year old male. Providers:         Gerrit Heck. Rayann Heman, MD Referring MD:      Vickki Muff. Chrismon, MD (Referring MD) Medicines:         Propofol per Anesthesia Complications:     No immediate complications. Procedure:         Pre-Anesthesia Assessment:                    - Prior to the procedure, a History and Physical was                     performed, and patient medications, allergies and                     sensitivities were reviewed. The patient's tolerance of                     previous anesthesia was reviewed.                    After obtaining informed consent, the colonoscope was                     passed under direct vision. Throughout the procedure, the                     patient's blood pressure, pulse, and oxygen saturations                     were monitored continuously. The Olympus CF-H180AL                     colonoscope ( S#: [id number] ) was introduced through the                     anus and advanced to the the terminal ileum. The                     colonoscopy was performed without difficulty. The patient                     tolerated the procedure well. The quality of the bowel                     preparation was good. Findings:      The perianal and digital rectal examinations were normal.      The terminal ileum appeared normal. Biopsies were taken  with a cold  forceps for histology.      There was a medium-sized lipoma, in the distal transverse colon.      A few small-mouthed diverticula were found in the sigmoid colon.      The exam was otherwise without abnormality on direct and retroflexion       views.      Multiple biopsies were obtained with cold forceps for histology randomly       in the proximal transverse colon, at the hepatic flexure, in the       ascending colon and in the cecum. Impression:        - The examined portion of the ileum was normal. Biopsied.                    - Medium-sized lipoma in the distal transverse colon.                    - Diverticulosis in the sigmoid colon.                    - The examination was otherwise normal on direct and                     retroflexion views.                    - Multiple biopsies were obtained in the proximal                     transverse colon, at the hepatic flexure, in the ascending                     colon and in the cecum.                    - Crohn's appears to be in remission endoscopically. Recommendation:    - Observe patient in GI recovery unit.                    - Resume regular diet.                    - Continue present medications.                    - Await pathology results.                    - Return to GI clinic.                    - Cont 6-MP 100 mg daily.                    - The findings and recommendations were discussed with the                     patient.                    - The findings and recommendations were discussed with the                     patient's family. Procedure Code(s): --- Professional ---                    [phone], Colonoscopy, flexible; with biopsy, single or  multiple Diagnosis Code(s): --- Professional ---                    K50.80, Crohn's disease of both small and large intestine                     without complications                    K57.30, Diverticulosis of large intestine  without                     perforation or abscess without bleeding CPT copyright 2014 American Medical Association. All rights reserved. The codes documented in this report are preliminary and upon coder review may  be revised to meet current compliance requirements. Mellody Life, MD 03/17/2015 12:15:45 PM This report has been signed electronically. Number of Addenda: 0 Note Initiated On: 03/17/2015 11:49 AM Scope Withdrawal Time: 0 hours 11 minutes 55 seconds  Total Procedure Duration: 0 hours 16 minutes 21 seconds       [facility]

## 2015-03-17 NOTE — Anesthesia Preprocedure Evaluation (Signed)
Anesthesia Evaluation  Patient identified by MRN, date of birth, ID band Patient awake    Reviewed: Allergy & Precautions, H&P , NPO status , Patient's Chart, lab work & pertinent test results, reviewed documented beta blocker date and time   History of Anesthesia Complications Negative for: history of anesthetic complications  Airway Mallampati: I  TM Distance: >3 FB Neck ROM: full    Dental no notable dental hx. (+) Teeth Intact, Missing   Pulmonary neg shortness of breath, sleep apnea (likely based on history) , neg COPD, neg recent URI, former smoker,    Pulmonary exam normal breath sounds clear to auscultation       Cardiovascular Exercise Tolerance: Good hypertension, (-) angina(-) CAD, (-) Past MI, (-) Cardiac Stents and (-) CABG Normal cardiovascular exam+ dysrhythmias (PVCs) (-) Valvular Problems/Murmurs Rhythm:regular Rate:Normal     Neuro/Psych negative neurological ROS  negative psych ROS   GI/Hepatic Neg liver ROS, GERD  Medicated and Controlled,  Endo/Other  neg diabetesMorbid obesity  Renal/GU negative Renal ROS  negative genitourinary   Musculoskeletal   Abdominal   Peds  Hematology negative hematology ROS (+)   Anesthesia Other Findings Past Medical History:   GERD (gastroesophageal reflux disease)                       Hypertension                                                 Obesity                                                      IBS (irritable bowel syndrome)                               Crohn's disease (HCC)                                        Reproductive/Obstetrics negative OB ROS                             Anesthesia Physical Anesthesia Plan  ASA: II  Anesthesia Plan: General   Post-op Pain Management:    Induction:   Airway Management Planned:   Additional Equipment:   Intra-op Plan:   Post-operative Plan:   Informed Consent: I have  reviewed the patients History and Physical, chart, labs and discussed the procedure including the risks, benefits and alternatives for the proposed anesthesia with the patient or authorized representative who has indicated his/her understanding and acceptance.   Dental Advisory Given  Plan Discussed with: Anesthesiologist, CRNA and Surgeon  Anesthesia Plan Comments:         Anesthesia Quick Evaluation

## 2015-03-17 NOTE — Anesthesia Postprocedure Evaluation (Signed)
Anesthesia Post Note  Patient: Steve Beck  Procedure(s) Performed: Procedure(s) (LRB): COLONOSCOPY WITH PROPOFOL (N/A)  Patient location during evaluation: Endoscopy Anesthesia Type: General Level of consciousness: awake and alert Pain management: pain level controlled Vital Signs Assessment: post-procedure vital signs reviewed and stable Respiratory status: spontaneous breathing, nonlabored ventilation, respiratory function stable and patient connected to nasal cannula oxygen Cardiovascular status: blood pressure returned to baseline and stable Postop Assessment: no signs of nausea or vomiting Anesthetic complications: no    Last Vitals:  Filed Vitals:   03/17/15 1240 03/17/15 1250  BP: 117/65 114/67  Pulse: 57 58  Temp:    Resp: 16 14    Last Pain: There were no vitals filed for this visit.               Martha Clan

## 2015-03-19 LAB — SURGICAL PATHOLOGY

## 2015-10-10 ENCOUNTER — Encounter: Payer: Self-pay | Admitting: Family Medicine

## 2015-10-10 ENCOUNTER — Ambulatory Visit (INDEPENDENT_AMBULATORY_CARE_PROVIDER_SITE_OTHER): Payer: Managed Care, Other (non HMO) | Admitting: Family Medicine

## 2015-10-10 VITALS — BP 128/84 | Temp 98.1°F | Resp 16 | Ht 76.0 in | Wt 338.0 lb

## 2015-10-10 DIAGNOSIS — I10 Essential (primary) hypertension: Secondary | ICD-10-CM

## 2015-10-10 DIAGNOSIS — Z6841 Body Mass Index (BMI) 40.0 and over, adult: Secondary | ICD-10-CM | POA: Diagnosis not present

## 2015-10-10 DIAGNOSIS — Z0000 Encounter for general adult medical examination without abnormal findings: Secondary | ICD-10-CM | POA: Diagnosis not present

## 2015-10-10 DIAGNOSIS — M722 Plantar fascial fibromatosis: Secondary | ICD-10-CM

## 2015-10-10 MED ORDER — VALSARTAN 80 MG PO TABS: 80.0000 mg | ORAL_TABLET | Freq: Every day | ORAL | Status: DC

## 2015-10-10 NOTE — Patient Instructions (Signed)
Plantar Fasciitis Plantar fasciitis is a painful foot condition that affects the heel. It occurs when the band of tissue that connects the toes to the heel bone (plantar fascia) becomes irritated. This can happen after exercising too much or doing other repetitive activities (overuse injury). The pain from plantar fasciitis can range from mild irritation to severe pain that makes it difficult for you to walk or move. The pain is usually worse in the morning or after you have been sitting or lying down for a while. CAUSES This condition may be caused by:  Standing for long periods of time.  Wearing shoes that do not fit.  Doing high-impact activities, including running, aerobics, and ballet.  Being overweight.  Having an abnormal way of walking (gait).  Having tight calf muscles.  Having high arches in your feet.  Starting a new athletic activity. SYMPTOMS The main symptom of this condition is heel pain. Other symptoms include:  Pain that gets worse after activity or exercise.  Pain that is worse in the morning or after resting.  Pain that goes away after you walk for a few minutes. DIAGNOSIS This condition may be diagnosed based on your signs and symptoms. Your health care provider will also do a physical exam to check for:  A tender area on the bottom of your foot.  A high arch in your foot.  Pain when you move your foot.  Difficulty moving your foot. You may also need to have imaging studies to confirm the diagnosis. These can include:  X-rays.  Ultrasound.  MRI. TREATMENT  Treatment for plantar fasciitis depends on the severity of the condition. Your treatment may include:  Rest, ice, and over-the-counter pain medicines to manage your pain.  Exercises to stretch your calves and your plantar fascia.  A splint that holds your foot in a stretched, upward position while you sleep (night splint).  Physical therapy to relieve symptoms and prevent problems in the  future.  Cortisone injections to relieve severe pain.  Extracorporeal shock wave therapy (ESWT) to stimulate damaged plantar fascia with electrical impulses. It is often used as a last resort before surgery.  Surgery, if other treatments have not worked after 12 months. HOME CARE INSTRUCTIONS  Take medicines only as directed by your health care provider.  Avoid activities that cause pain.  Roll the bottom of your foot over a bag of ice or a bottle of cold water. Do this for 20 minutes, 3-4 times a day.  Perform simple stretches as directed by your health care provider.  Try wearing athletic shoes with air-sole or gel-sole cushions or soft shoe inserts.  Wear a night splint while sleeping, if directed by your health care provider.  Keep all follow-up appointments with your health care provider. PREVENTION   Do not perform exercises or activities that cause heel pain.  Consider finding low-impact activities if you continue to have problems.  Lose weight if you need to. The best way to prevent plantar fasciitis is to avoid the activities that aggravate your plantar fascia. SEEK MEDICAL CARE IF:  Your symptoms do not go away after treatment with home care measures.  Your pain gets worse.  Your pain affects your ability to move or do your daily activities.   This information is not intended to replace advice given to you by your health care provider. Make sure you discuss any questions you have with your health care provider.   Document Released: 12/22/2000 Document Revised: 12/18/2014 Document Reviewed: 02/06/2014 Elsevier   Interactive Patient Education 2016 Elsevier Inc.  

## 2015-10-10 NOTE — Progress Notes (Signed)
Patient: Steve Beck, Male    DOB: 1970/12/06, 45 y.o.   MRN: [id number] Visit Date: 10/10/2015  Today's Provider: Vernie Murders, PA   Chief Complaint  Patient presents with  . Annual Exam  . Foot Pain    left foot    Subjective:    Annual physical exam Steve Beck is a 45 y.o. male who presents today for health maintenance and complete physical. He feels well. He reports exercising occasionally. He reports he is sleeping well. Patient reports that he sleeps on average 6 hours a night.    Left foot pain  Patient reports that he has had pain in his left foot that comes and goes. Patient reports that the pain sometimes wakes him up at night. Patient reports that he has had the pain for several weeks. He denies any injury. He takes occasional NSAIDs with mild relief.   Review of Systems  Constitutional: Negative.   HENT: Negative.   Eyes: Negative.   Respiratory: Negative.   Cardiovascular: Negative.   Gastrointestinal: Negative.   Endocrine: Negative.   Musculoskeletal: Positive for arthralgias. Negative for myalgias, back pain, joint swelling, gait problem, neck pain and neck stiffness.       Joint pain in left foot.   Skin: Negative.   Allergic/Immunologic: Negative.   Neurological: Negative.   Hematological: Negative.   Psychiatric/Behavioral: Negative.     Social History      He  reports that he has quit smoking. He does not have any smokeless tobacco history on file. He reports that he does not drink alcohol or use illicit drugs.       Social History   Social History  . Marital Status: Married    Spouse Name: N/A  . Number of Children: N/A  . Years of Education: N/A   Occupational History  .      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   Social History Main Topics  . Smoking status: Former Research scientist (life sciences)  . Smokeless tobacco: None     Comment: Smoked for 17 years - quit ~2006  . Alcohol Use: No  . Drug Use: No  . Sexual Activity: Not  Asked   Other Topics Concern  . None   Social History Narrative    Past Medical History  Diagnosis Date  . GERD (gastroesophageal reflux disease)   . Hypertension   . Obesity   . IBS (irritable bowel syndrome)   . Crohn's disease [facility])    Patient Active Problem List   Diagnosis Date Noted  . Chest pain 03/23/2013  . HTN (hypertension) 03/23/2013    Past Surgical History  Procedure Laterality Date  . None    . Left heart catheterization with coronary angiogram N/A 03/23/2013    Procedure: LEFT HEART CATHETERIZATION WITH CORONARY ANGIOGRAM;  Surgeon: Larey Dresser, MD;  Location: [facility] CATH LAB;  Service: Cardiovascular;  Laterality: N/A;  . Colonoscopy    . Colonoscopy with propofol N/A 03/17/2015    Procedure: COLONOSCOPY WITH PROPOFOL;  Surgeon: Josefine Class, MD;  Location: [facility] ENDOSCOPY;  Service: Endoscopy;  Laterality: N/A;    Family History        Family Status  Relation Status Death Age  . Father Deceased   . Mother Alive   . Sister Alive   . Brother Alive         His family history includes Diabetes Mellitus II in his other; Esophageal cancer in his father; Hypertension  in his mother; Migraines in his mother; Pulmonary embolism in his father.    Allergies  Allergen Reactions  . Augmentin [Amoxicillin-Pot Clavulanate]     Current Meds  Medication Sig  . azaTHIOprine (IMURAN) 50 MG tablet Take 100 mg by mouth daily.  . cetirizine (ZYRTEC) 10 MG tablet Take 10 mg by mouth daily.  Marland Kitchen esomeprazole (NEXIUM) 40 MG capsule Take 40 mg by mouth daily at 12 noon.  . fluticasone (FLONASE) 50 MCG/ACT nasal spray Place 2 sprays into both nostrils daily.  Marland Kitchen lisinopril (PRINIVIL,ZESTRIL) 10 MG tablet Take 10 mg by mouth daily.  Earney Navy Bicarbonate (ZEGERID) 20-1100 MG CAPS capsule Take 1 capsule by mouth daily before breakfast.  . vitamin B-12 (CYANOCOBALAMIN) 1000 MCG tablet Take 1,000 mcg by mouth daily.    Patient Care Team: Margo Common, PA  as PCP - General (Family Medicine)     Objective:   Vitals: BP 128/84 mmHg  Temp(Src) 98.1 F (36.7 C)  Resp 16  Ht 6\' 4"  (1.93 m)  Wt 338 lb (153.316 kg)  BMI 41.16 kg/m2  Wt Readings from Last 3 Encounters:  10/10/15 338 lb (153.316 kg)  03/17/15 326 lb (147.873 kg)  03/23/13 327 lb 3.2 oz (148.417 kg)    Physical Exam  Constitutional: He is oriented to person, place, and time. He appears well-developed and well-nourished.  HENT:  Head: Normocephalic and atraumatic.  Right Ear: External ear normal.  Left Ear: External ear normal.  Nose: Nose normal.  Mouth/Throat: Oropharynx is clear and moist.  Eyes: Conjunctivae and EOM are normal. Pupils are equal, round, and reactive to light. Right eye exhibits no discharge.  Neck: Normal range of motion. Neck supple. No tracheal deviation present. No thyromegaly present.  Cardiovascular: Normal rate, regular rhythm, normal heart sounds and intact distal pulses.   No murmur heard. Pulmonary/Chest: Effort normal and breath sounds normal. No respiratory distress. He has no wheezes. He has no rales. He exhibits no tenderness.  Abdominal: Soft. Bowel sounds are normal. He exhibits no distension and no mass. There is no tenderness. There is no rebound and no guarding.  Genitourinary: Rectum normal and prostate normal. Guaiac negative stool.  Musculoskeletal: Normal range of motion. He exhibits no edema or tenderness.  Lymphadenopathy:    He has no cervical adenopathy.  Neurological: He is alert and oriented to person, place, and time. He has normal reflexes. No cranial nerve deficit. He exhibits normal muscle tone. Coordination normal.  Skin: Skin is warm and dry. No rash noted. No erythema.  Psychiatric: He has a normal mood and affect. His behavior is normal. Judgment and thought content normal.   Depression Screen PHQ 2/9 Scores 10/10/2015  PHQ - 2 Score 0    Assessment & Plan:     Routine Health Maintenance and Physical  Exam  Exercise Activities and Dietary recommendations Goals    Some walking for exercise but unable with fasciitis problem in the left heel.  Need to lose weight with lower caloric intake. Will check labs for metabolic disorder.   Last tetanus was July 2013.  Discussed health benefits of physical activity, and encouraged him to engage in regular exercise appropriate for his age and condition.    -------------------------------------------------------------------- 1. Annual physical exam Stable. Last colonoscopy by Dr. Rayann Heman was 03-17-15 to follow up Crohn's disease and still on Imuran. Last tetanus was 2013. Given anticipatory guidance.  2. Essential hypertension Stable. Concerned about dry cough being due to Lisinopril and want to change to  an ARB. Recheck labs and sent Valsartan prescription to his mail order pharmacy. Will monitor BP and call report of response in 10-14 days. - valsartan (DIOVAN) 80 MG tablet; Take 1 tablet (80 mg total) by mouth daily.  Dispense: 90 tablet; Refill: 3 - CBC with Differential/Platelet - Lipid panel  3. BMI 40.0-44.9, adult (HCC) Intermittent gain and loss. Will check labs for metabolic disorders. Recommend lowering caloric intake and exercise regularly. - Comprehensive metabolic panel - Hemoglobin A1c - TSH - Lipid panel  4. Plantar fasciitis, left Onset over the past several months. No known injury. Given instructions about arch supports, stretching exercises and ice massage to the left heel. May use Aleve prn and recheck if no better in the next 2-3 weeks. May need referral to podiatrist.   Vernie Murders, [facility]

## 2015-10-21 ENCOUNTER — Telehealth: Payer: Self-pay

## 2015-10-21 LAB — TSH: TSH: 1.33 u[IU]/mL (ref 0.450–4.500)

## 2015-10-21 LAB — COMPREHENSIVE METABOLIC PANEL
A/G RATIO: 1.8 (ref 1.2–2.2)
ALBUMIN: 4.6 g/dL (ref 3.5–5.5)
ALK PHOS: 68 IU/L (ref 39–117)
ALT: 26 IU/L (ref 0–44)
AST: 16 IU/L (ref 0–40)
BILIRUBIN TOTAL: 0.2 mg/dL (ref 0.0–1.2)
BUN / CREAT RATIO: 17 (ref 9–20)
BUN: 11 mg/dL (ref 6–24)
CO2: 25 mmol/L (ref 18–29)
Calcium: 9.8 mg/dL (ref 8.7–10.2)
Chloride: 99 mmol/L (ref 96–106)
Creatinine, Ser: 0.63 mg/dL — ABNORMAL LOW (ref 0.76–1.27)
GFR calc Af Amer: 136 mL/min/{1.73_m2} (ref >59)
GFR calc non Af Amer: 117 mL/min/{1.73_m2} (ref >59)
GLOBULIN, TOTAL: 2.5 g/dL (ref 1.5–4.5)
Glucose: 102 mg/dL — ABNORMAL HIGH (ref 65–99)
POTASSIUM: 4.6 mmol/L (ref 3.5–5.2)
SODIUM: 140 mmol/L (ref 134–144)
Total Protein: 7.1 g/dL (ref 6.0–8.5)

## 2015-10-21 LAB — LIPID PANEL
CHOL/HDL RATIO: 3.7 ratio (ref 0.0–5.0)
Cholesterol, Total: 128 mg/dL (ref 100–199)
HDL: 35 mg/dL — ABNORMAL LOW (ref >39)
LDL CALC: 74 mg/dL (ref 0–99)
TRIGLYCERIDES: 94 mg/dL (ref 0–149)
VLDL Cholesterol Cal: 19 mg/dL (ref 5–40)

## 2015-10-21 LAB — CBC WITH DIFFERENTIAL/PLATELET
BASOS: 1 %
Basophils Absolute: 0 10*3/uL (ref 0.0–0.2)
EOS (ABSOLUTE): 0.2 10*3/uL (ref 0.0–0.4)
EOS: 3 %
HEMATOCRIT: 45.1 % (ref 37.5–51.0)
HEMOGLOBIN: 14 g/dL (ref 12.6–17.7)
Immature Grans (Abs): 0 10*3/uL (ref 0.0–0.1)
Immature Granulocytes: 0 %
LYMPHS ABS: 1.4 10*3/uL (ref 0.7–3.1)
Lymphs: 23 %
MCH: 28.6 pg (ref 26.6–33.0)
MCHC: 31 g/dL — AB (ref 31.5–35.7)
MCV: 92 fL (ref 79–97)
MONOS ABS: 0.7 10*3/uL (ref 0.1–0.9)
Monocytes: 12 %
NEUTROS ABS: 3.7 10*3/uL (ref 1.4–7.0)
Neutrophils: 61 %
Platelets: 353 10*3/uL (ref 150–379)
RBC: 4.9 x10E6/uL (ref 4.14–5.80)
RDW: 14.5 % (ref 12.3–15.4)
WBC: 6 10*3/uL (ref 3.4–10.8)

## 2015-10-21 LAB — HEMOGLOBIN A1C
ESTIMATED AVERAGE GLUCOSE: 126 mg/dL
HEMOGLOBIN A1C: 6 % — AB (ref 4.8–5.6)

## 2015-10-21 NOTE — Telephone Encounter (Signed)
-----   Message from Margo Common, [place] sent at 10/21/2015  9:12 AM EDT ----- All blood tests essentially normal except HDL cholesterol low. Hgb A1C in the prediabetic level. Exercise, low fat diet, weight loss and Krill Oil (MegaRed) qd can help. Recheck progress in 3 months.

## 2015-10-21 NOTE — Telephone Encounter (Signed)
Left message to call back  

## 2015-10-21 NOTE — Telephone Encounter (Signed)
Pt is returning call.  CB#[phone]/MW

## 2015-10-21 NOTE — Telephone Encounter (Signed)
Advised patient of results.  

## 2016-10-01 ENCOUNTER — Other Ambulatory Visit: Payer: Self-pay | Admitting: Family Medicine

## 2016-10-01 DIAGNOSIS — I10 Essential (primary) hypertension: Secondary | ICD-10-CM

## 2016-10-01 MED ORDER — VALSARTAN 80 MG PO TABS: 80.0000 mg | ORAL_TABLET | Freq: Every day | ORAL | 3 refills | Status: DC

## 2016-10-01 NOTE — Telephone Encounter (Signed)
Pt needs refill on his   valsartan (DIOVAN) 80 MG tablet  He uses [facility] order  Thanks [employer]

## 2016-11-04 ENCOUNTER — Ambulatory Visit (INDEPENDENT_AMBULATORY_CARE_PROVIDER_SITE_OTHER): Payer: Managed Care, Other (non HMO) | Admitting: Family Medicine

## 2016-11-04 ENCOUNTER — Encounter: Payer: Self-pay | Admitting: Family Medicine

## 2016-11-04 VITALS — BP 144/92 | HR 64 | Temp 97.5°F | Resp 16 | Ht 76.0 in | Wt 329.0 lb

## 2016-11-04 DIAGNOSIS — I10 Essential (primary) hypertension: Secondary | ICD-10-CM

## 2016-11-04 DIAGNOSIS — Z0000 Encounter for general adult medical examination without abnormal findings: Secondary | ICD-10-CM | POA: Diagnosis not present

## 2016-11-04 DIAGNOSIS — Z114 Encounter for screening for human immunodeficiency virus [HIV]: Secondary | ICD-10-CM

## 2016-11-04 DIAGNOSIS — Z6841 Body Mass Index (BMI) 40.0 and over, adult: Secondary | ICD-10-CM | POA: Diagnosis not present

## 2016-11-04 DIAGNOSIS — R351 Nocturia: Secondary | ICD-10-CM

## 2016-11-04 DIAGNOSIS — Z8719 Personal history of other diseases of the digestive system: Secondary | ICD-10-CM | POA: Diagnosis not present

## 2016-11-04 DIAGNOSIS — K219 Gastro-esophageal reflux disease without esophagitis: Secondary | ICD-10-CM

## 2016-11-04 MED ORDER — OLMESARTAN MEDOXOMIL 20 MG PO TABS: 20.0000 mg | ORAL_TABLET | Freq: Every day | ORAL | 2 refills | Status: DC

## 2016-11-04 NOTE — Patient Instructions (Signed)

## 2016-11-04 NOTE — Progress Notes (Signed)
Patient: Steve Beck, Male    DOB: August 07, 1970, 46 y.o.   MRN: [id number] Visit Date: 11/04/2016  Today's Provider: Vernie Murders, PA   Chief Complaint  Patient presents with  . Annual Exam   Subjective:    Annual physical exam Steve Beck is a 46 y.o. male who presents today for health maintenance and complete physical. He feels well. He reports he is not exercising. He reports he is sleeping well.  Last colonoscopy- 10/10/2015- Dr. Rayann Heman. Crohn's in remission and diverticulosis.  Last labs on 10/20/2015 showed pt's HDL was low, and his A1C was elevated at 6.0%. He was advised to work on exercise, low fat diet, weight loss and to start krill oil. He is not taking Krill Oil. He is not exercising, but he is eating healthier. -----------------------------------------------------------------   Review of Systems  Constitutional: Negative.   HENT: Positive for rhinorrhea. Negative for congestion, dental problem, drooling, ear discharge, ear pain, facial swelling, hearing loss, mouth sores, nosebleeds, postnasal drip, sinus pain, sinus pressure, sneezing, sore throat, tinnitus, trouble swallowing and voice change.   Eyes: Negative.   Respiratory: Negative.   Cardiovascular: Negative.   Gastrointestinal: Negative.   Endocrine: Negative.   Genitourinary: Negative.   Musculoskeletal: Positive for arthralgias and back pain. Negative for gait problem, joint swelling, myalgias, neck pain and neck stiffness.  Skin: Negative.   Allergic/Immunologic: Negative.   Neurological: Positive for numbness (in toes x 2 years. Unchanged per pt). Negative for dizziness, tremors, seizures, syncope, facial asymmetry, speech difficulty, weakness, light-headedness and headaches.  Hematological: Negative.   Psychiatric/Behavioral: Negative.     Social History      He  reports that he has quit smoking. He has quit using smokeless tobacco. His smokeless tobacco use included Chew. He reports that  he does not drink alcohol or use drugs.       Social History   Social History  . Marital status: Married    Spouse name: Butch Penny  . Number of children: 4  . Years of education: 12   Occupational History  .  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   Social History Main Topics  . Smoking status: Former Research scientist (life sciences)  . Smokeless tobacco: Former Systems developer    Types: Chew     Comment: Smoked for 17 years - quit ~2006  . Alcohol use No  . Drug use: No  . Sexual activity: Yes   Other Topics Concern  . None   Social History Narrative  . None    Past Medical History:  Diagnosis Date  . Crohn's disease ([place])   . GERD (gastroesophageal reflux disease)   . Hypertension   . IBS (irritable bowel syndrome)   . Obesity     Patient Active Problem List   Diagnosis Date Noted  . BMI 40.0-44.9, adult ([place]) 10/10/2015  . Chest pain 03/23/2013  . HTN (hypertension) 03/23/2013    Past Surgical History:  Procedure Laterality Date  . COLONOSCOPY    . COLONOSCOPY WITH PROPOFOL N/A 03/17/2015   Procedure: COLONOSCOPY WITH PROPOFOL;  Surgeon: Josefine Class, MD;  Location[facility] ENDOSCOPY;  Service: Endoscopy;  Laterality: N/A;  . LEFT HEART CATHETERIZATION WITH CORONARY ANGIOGRAM N/A 03/23/2013   Procedure: LEFT HEART CATHETERIZATION WITH CORONARY ANGIOGRAM;  Surgeon: Larey Dresser, MD;  Location: [facility] CATH LAB;  Service: Cardiovascular;  Laterality: N/A;  . none      Family History  Family Status  Relation Status  . Father Deceased  . Mother Alive  . Sister Alive  . Brother Alive  . Other (Not Specified)        His family history includes Cancer in his brother; Diabetes Mellitus II in his other; Esophageal cancer in his father; Healthy in his sister; Hypertension in his mother; Migraines in his mother; Pulmonary embolism in his father.     Allergies  Allergen Reactions  . Augmentin [Amoxicillin-Pot Clavulanate]     Current Outpatient  Prescriptions:  .  cetirizine (ZYRTEC) 10 MG tablet, Take 10 mg by mouth daily., Disp: , Rfl:  .  esomeprazole (NEXIUM) 40 MG capsule, Take 40 mg by mouth daily at 12 noon., Disp: , Rfl:  .  valsartan (DIOVAN) 80 MG tablet, Take 1 tablet (80 mg total) by mouth daily., Disp: 90 tablet, Rfl: 3 .  fluticasone (FLONASE) 50 MCG/ACT nasal spray, Place 2 sprays into both nostrils daily., Disp: , Rfl:  .  vitamin B-12 (CYANOCOBALAMIN) 1000 MCG tablet, Take 1,000 mcg by mouth daily., Disp: , Rfl:    Patient Care Team: Margo Common, PA as PCP - General (Family Medicine)      Objective:   Vitals: BP (!) 144/92 (BP Location: Right Arm, Cuff Size: Large)   Pulse 64   Temp (!) 97.5 F (36.4 C) (Oral)   Resp 16   Ht 6\' 4"  (1.93 m)   Wt (!) 329 lb (149.2 kg)   BMI 40.05 kg/m    Vitals:   11/04/16 0915 11/04/16 0924  BP: (!) 122/94 (!) 144/92  Pulse: 64   Resp: 16   Temp: (!) 97.5 F (36.4 C)   TempSrc: Oral   Weight: (!) 329 lb (149.2 kg)   Height: 6\' 4"  (1.93 m)     Wt Readings from Last 3 Encounters:  11/04/16 (!) 329 lb (149.2 kg)  10/10/15 (!) 338 lb (153.3 kg)  03/17/15 (!) 326 lb (147.9 kg)    Physical Exam  Constitutional: He is oriented to person, place, and time. He appears well-developed and well-nourished.  HENT:  Head: Normocephalic and atraumatic.  Right Ear: External ear normal.  Left Ear: External ear normal.  Nose: Nose normal.  Mouth/Throat: Oropharynx is clear and moist.  Eyes: Pupils are equal, round, and reactive to light. Conjunctivae and EOM are normal. Right eye exhibits no discharge.  Neck: Normal range of motion. Neck supple. No tracheal deviation present. No thyromegaly present.  Cardiovascular: Normal rate, regular rhythm, normal heart sounds and intact distal pulses.   No murmur heard. Pulmonary/Chest: Effort normal and breath sounds normal. No respiratory distress. He has no wheezes. He has no rales. He exhibits no tenderness.  Abdominal: Soft.  He exhibits no distension and no mass. There is no tenderness. There is no rebound and no guarding.  Genitourinary: Prostate normal and penis normal. Rectal exam shows guaiac positive stool.  Musculoskeletal: Normal range of motion. He exhibits no edema or tenderness.  Lymphadenopathy:    He has no cervical adenopathy.  Neurological: He is alert and oriented to person, place, and time. He has normal reflexes. No cranial nerve deficit. He exhibits normal muscle tone. Coordination normal.  Numbness in the left 4th toe and decreased sensation plantar surface of the left foot over the metatarsals and heel. No callus or ulcer. Good pulses bilaterally.  Skin: Skin is warm and dry. No rash noted. No erythema.  Psychiatric: He has a normal mood and affect. His behavior is normal.  Judgment and thought content normal.    Depression Screen PHQ 2/9 Scores 11/04/2016 10/10/2015  PHQ - 2 Score 1 0    Assessment & Plan:     Routine Health Maintenance and Physical Exam  Exercise Activities and Dietary recommendations Goals    None       There is no immunization history on file for this patient.  Health Maintenance  Topic Date Due  . HIV Screening  04/19/1983  . INFLUENZA VACCINE  11/10/2016  . TETANUS/TDAP  10/10/2021    Discussed health benefits of physical activity, and encouraged him to engage in regular exercise appropriate for his age and condition.    -------------------------------------------------------------------- 1. Annual physical exam Stable health. Last Tdap was July 2013 in [place]. Colonoscopy in 2016 by Dr. Rayann Heman showed remission of Crohns's disease. Feeling well in general. Given anticipatory guidance and will get routine labs.  2. Essential hypertension A little elevated BP today. Recent recall of Diovan (valsartan) due to cancer risk. Will switch to Olmesartan and get follow up labs. Recheck BP by nurse or monitor at home. Call report of readings in 2 weeks to see if  dosage adjustments needed. Could not take Lisinopril in the past due to cough. No problems with ARB's. - olmesartan (BENICAR) 20 MG tablet; Take 1 tablet (20 mg total) by mouth daily.  Dispense: 30 tablet; Refill: 2 - CBC with Differential/Platelet - Comprehensive metabolic panel - Lipid panel - TSH  3. BMI 40.0-44.9, adult [facility]) Working on diet and feels he has lost 10 lbs over the past year. Recheck labs and continue weight loss efforts. Snoring has stopped with this much weight loss. - CBC with Differential/Platelet - Comprehensive metabolic panel - Lipid panel - TSH - Hemoglobin A1c  4. Hx of Crohn's disease Diagnosed by Dr. Rayann Heman (gastroenterologist) by colonoscopy. Repeat colonoscopy in 2016 showed Crohn's in full remission. Denies diarrhea, hematochezia or abdominal pains. Hemoccult by DRE positive for blood today. Recommend follow up labs and may need recheck with gastroenterologist if symptoms recurring. - CBC with Differential/Platelet - Comprehensive metabolic panel  5. Gastroesophageal reflux disease, esophagitis presence not specified Well controlled by use of Omeprazole. Denies hematemesis or hematochezia. - CBC with Differential/Platelet  6. Nocturia Get up to urinate 3 times a night and 3-4 times during the day. Denies hematuria, dribbling or decreased stream. Will check labs. DRE essentially normal prostate. - Comprehensive metabolic panel - PSA  7. Screening for HIV (human immunodeficiency virus) - HIV antibody    Vernie Murders, PA  [facility]

## 2016-11-05 LAB — CBC WITH DIFFERENTIAL/PLATELET
BASOS ABS: 0.1 10*3/uL (ref 0.0–0.2)
Basos: 1 %
EOS (ABSOLUTE): 0.1 10*3/uL (ref 0.0–0.4)
Eos: 2 %
Hematocrit: 42.8 % (ref 37.5–51.0)
Hemoglobin: 13.8 g/dL (ref 13.0–17.7)
IMMATURE GRANULOCYTES: 0 %
Immature Grans (Abs): 0 10*3/uL (ref 0.0–0.1)
Lymphocytes Absolute: 1.9 10*3/uL (ref 0.7–3.1)
Lymphs: 30 %
MCH: 29.1 pg (ref 26.6–33.0)
MCHC: 32.2 g/dL (ref 31.5–35.7)
MCV: 90 fL (ref 79–97)
MONOS ABS: 0.7 10*3/uL (ref 0.1–0.9)
Monocytes: 11 %
NEUTROS PCT: 56 %
Neutrophils Absolute: 3.5 10*3/uL (ref 1.4–7.0)
Platelets: 360 10*3/uL (ref 150–379)
RBC: 4.74 x10E6/uL (ref 4.14–5.80)
RDW: 14.8 % (ref 12.3–15.4)
WBC: 6.4 10*3/uL (ref 3.4–10.8)

## 2016-11-05 LAB — LIPID PANEL
Chol/HDL Ratio: 3.5 ratio (ref 0.0–5.0)
Cholesterol, Total: 123 mg/dL (ref 100–199)
HDL: 35 mg/dL — ABNORMAL LOW (ref >39)
LDL CALC: 64 mg/dL (ref 0–99)
Triglycerides: 118 mg/dL (ref 0–149)
VLDL Cholesterol Cal: 24 mg/dL (ref 5–40)

## 2016-11-05 LAB — COMPREHENSIVE METABOLIC PANEL
A/G RATIO: 2 (ref 1.2–2.2)
ALT: 31 IU/L (ref 0–44)
AST: 20 IU/L (ref 0–40)
Albumin: 4.5 g/dL (ref 3.5–5.5)
Alkaline Phosphatase: 76 IU/L (ref 39–117)
BUN/Creatinine Ratio: 15 (ref 9–20)
BUN: 11 mg/dL (ref 6–24)
Bilirubin Total: 0.4 mg/dL (ref 0.0–1.2)
CALCIUM: 9.8 mg/dL (ref 8.7–10.2)
CO2: 27 mmol/L (ref 20–29)
CREATININE: 0.75 mg/dL — AB (ref 0.76–1.27)
Chloride: 102 mmol/L (ref 96–106)
GFR calc Af Amer: 125 mL/min/{1.73_m2} (ref >59)
GFR, EST NON AFRICAN AMERICAN: 108 mL/min/{1.73_m2} (ref >59)
GLUCOSE: 89 mg/dL (ref 65–99)
Globulin, Total: 2.3 g/dL (ref 1.5–4.5)
POTASSIUM: 4.9 mmol/L (ref 3.5–5.2)
Sodium: 143 mmol/L (ref 134–144)
Total Protein: 6.8 g/dL (ref 6.0–8.5)

## 2016-11-05 LAB — PSA: Prostate Specific Ag, Serum: 0.3 ng/mL (ref 0.0–4.0)

## 2016-11-05 LAB — TSH: TSH: 1.32 u[IU]/mL (ref 0.450–4.500)

## 2016-11-05 LAB — HIV ANTIBODY (ROUTINE TESTING W REFLEX): HIV Screen 4th Generation wRfx: NONREACTIVE

## 2016-11-05 LAB — HEMOGLOBIN A1C
Est. average glucose Bld gHb Est-mCnc: 123 mg/dL
HEMOGLOBIN A1C: 5.9 % — AB (ref 4.8–5.6)

## 2017-01-31 ENCOUNTER — Other Ambulatory Visit: Payer: Self-pay | Admitting: Family Medicine

## 2017-01-31 DIAGNOSIS — I10 Essential (primary) hypertension: Secondary | ICD-10-CM

## 2017-08-31 ENCOUNTER — Other Ambulatory Visit: Payer: Self-pay | Admitting: Family Medicine

## 2017-08-31 DIAGNOSIS — I10 Essential (primary) hypertension: Secondary | ICD-10-CM

## 2017-11-18 ENCOUNTER — Encounter: Payer: Self-pay | Admitting: Family Medicine

## 2017-11-18 ENCOUNTER — Ambulatory Visit (INDEPENDENT_AMBULATORY_CARE_PROVIDER_SITE_OTHER): Payer: Managed Care, Other (non HMO) | Admitting: Family Medicine

## 2017-11-18 VITALS — BP 140/92 | HR 66 | Temp 98.1°F | Resp 16 | Ht 74.5 in | Wt 332.8 lb

## 2017-11-18 DIAGNOSIS — K219 Gastro-esophageal reflux disease without esophagitis: Secondary | ICD-10-CM

## 2017-11-18 DIAGNOSIS — I10 Essential (primary) hypertension: Secondary | ICD-10-CM

## 2017-11-18 DIAGNOSIS — H6123 Impacted cerumen, bilateral: Secondary | ICD-10-CM

## 2017-11-18 DIAGNOSIS — Z87442 Personal history of urinary calculi: Secondary | ICD-10-CM | POA: Insufficient documentation

## 2017-11-18 DIAGNOSIS — Z0000 Encounter for general adult medical examination without abnormal findings: Secondary | ICD-10-CM

## 2017-11-18 DIAGNOSIS — Z6841 Body Mass Index (BMI) 40.0 and over, adult: Secondary | ICD-10-CM

## 2017-11-18 DIAGNOSIS — Z8719 Personal history of other diseases of the digestive system: Secondary | ICD-10-CM

## 2017-11-18 NOTE — Patient Instructions (Addendum)
We will call you with the lab results. Do follow up with your stomach specialist and see if they need to see you. Update dental exam. Try to start exercising daily for 30 minutes (try to get into a pre-work routine).

## 2017-11-18 NOTE — Progress Notes (Signed)
  Subjective:     Patient ID: Steve Beck, male   DOB: [birth year], 47 y.o.   MRN: [id number] Chief Complaint  Patient presents with  . Annual Exam    Patient comes in office today for his annual physical he states that he is feeling well today and has no concerns to address. Patient reports that his diet is unhealthy, that he is not actively exercising and is sleeping on average 8hrs a night. Patient reports last Tdap vaccine was in 2013 at [facility] .    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 Reports his ankles hurt at the end of his workday so he is not inclined to exercise.He has retired as a Public librarian.  Review of Systems General: Feeling well HEENT:no regular dental visits and is encouraged to do so. Has had an eye exam in the last two years with no visual changes (glasses). Reports mild HOH. Cardiovascular: no chest pain, shortness of breath, or palpitations GI: controls heartburn with otc PPI. No change in bowel habits or blood in the stool. Crohn's is in remission with last colonoscopy in 2016. GU: nocturia x 1-2, no change in bladder habits  Neuro: chronic numbness in three toes of left foot Psychiatric: not depressed Musculoskeletal: ankle pain from prolonged standing.    Objective:   Physical Exam  Constitutional: He appears well-developed and well-nourished. No distress.  Eyes: PERRLA, EOMI Neck: no thyromegaly, tenderness or nodules, no cervical adenopathy or carotid bruits ENT: TM's intact without inflammation; No tonsillar enlargement or exudate, TM's obscured by cerumen. Irrigation attempted but patient got dizzy and did not tolerate. Lungs: Clear Heart : RRR without murmur or gallop Abd: bowel sounds present, soft, non-tender Extremities: no edema Skin:Back with solitary seborrheic keratosis     Assessment:    1. Annual physical exam - Lipid panel - Hemoglobin A1c  2. Essential hypertension: Increased in the office. Nurse bp check  in few weeks. - Comprehensive metabolic panel  3. Hx of Crohn's disease: encouraged him to call [facility].I and check if time for follow up.  4. Gastroesophageal reflux disease without esophagitis  5. BMI 40.0-44.9, adult ([place])  6. Obstruction of ventilation tube by cerumen, bilateral: discussed use of otc cerumen kits/mineral oil drops.  7. Hx of kidney stones      Plan:    Further f/u pending lab results. Discussed exercising for 30 minutes before work.

## 2017-11-19 LAB — COMPREHENSIVE METABOLIC PANEL
ALT: 28 IU/L (ref 0–44)
AST: 18 IU/L (ref 0–40)
Albumin/Globulin Ratio: 1.9 (ref 1.2–2.2)
Albumin: 4.6 g/dL (ref 3.5–5.5)
Alkaline Phosphatase: 66 IU/L (ref 39–117)
BUN / CREAT RATIO: 17 (ref 9–20)
BUN: 11 mg/dL (ref 6–24)
Bilirubin Total: 0.4 mg/dL (ref 0.0–1.2)
CALCIUM: 9.5 mg/dL (ref 8.7–10.2)
CO2: 23 mmol/L (ref 20–29)
CREATININE: 0.65 mg/dL — AB (ref 0.76–1.27)
Chloride: 103 mmol/L (ref 96–106)
GFR calc Af Amer: 132 mL/min/{1.73_m2} (ref >59)
GFR, EST NON AFRICAN AMERICAN: 114 mL/min/{1.73_m2} (ref >59)
GLOBULIN, TOTAL: 2.4 g/dL (ref 1.5–4.5)
Glucose: 91 mg/dL (ref 65–99)
Potassium: 4.7 mmol/L (ref 3.5–5.2)
SODIUM: 142 mmol/L (ref 134–144)
Total Protein: 7 g/dL (ref 6.0–8.5)

## 2017-11-19 LAB — LIPID PANEL
CHOL/HDL RATIO: 3.8 ratio (ref 0.0–5.0)
Cholesterol, Total: 129 mg/dL (ref 100–199)
HDL: 34 mg/dL — ABNORMAL LOW (ref >39)
LDL CALC: 73 mg/dL (ref 0–99)
TRIGLYCERIDES: 109 mg/dL (ref 0–149)
VLDL Cholesterol Cal: 22 mg/dL (ref 5–40)

## 2017-11-19 LAB — HEMOGLOBIN A1C
ESTIMATED AVERAGE GLUCOSE: 128 mg/dL
HEMOGLOBIN A1C: 6.1 % — AB (ref 4.8–5.6)

## 2017-11-21 ENCOUNTER — Other Ambulatory Visit: Payer: Self-pay | Admitting: Family Medicine

## 2017-11-21 ENCOUNTER — Telehealth: Payer: Self-pay

## 2017-11-21 DIAGNOSIS — I10 Essential (primary) hypertension: Secondary | ICD-10-CM

## 2017-11-21 MED ORDER — OLMESARTAN MEDOXOMIL 20 MG PO TABS: 20.0000 mg | ORAL_TABLET | Freq: Every day | ORAL | 3 refills | Status: DC

## 2017-11-21 NOTE — Telephone Encounter (Signed)
Patient was advised, he requested refill of his blood pressure medication sent to [facility].KW

## 2017-11-21 NOTE — Telephone Encounter (Signed)
-----   Message from Carmon Ginsberg, [place] sent at 11/21/2017  7:29 AM EDT ----- Labs ok. A1C remains in "pre-diabetic" range. Please consider exercise daily.

## 2017-11-21 NOTE — Telephone Encounter (Signed)
done

## 2018-10-30 ENCOUNTER — Other Ambulatory Visit: Payer: Self-pay

## 2018-10-30 DIAGNOSIS — I10 Essential (primary) hypertension: Secondary | ICD-10-CM

## 2018-10-30 MED ORDER — OLMESARTAN MEDOXOMIL 20 MG PO TABS: 20.0000 mg | ORAL_TABLET | Freq: Every day | ORAL | 0 refills | Status: DC

## 2018-10-30 NOTE — Telephone Encounter (Signed)
Patient wife states that patient need a refill on his Benicar 20 MG. L.O.V. 11/18/2017, please advise.

## 2018-11-09 ENCOUNTER — Other Ambulatory Visit: Payer: Self-pay | Admitting: Family Medicine

## 2018-11-09 DIAGNOSIS — I10 Essential (primary) hypertension: Secondary | ICD-10-CM

## 2018-11-09 NOTE — Telephone Encounter (Signed)
Need to schedule follow up office visit or virtual visit with fasting labs. If he is sure the [facility] is correct, we can sent the refill there for the next 90 days. If the [facility] is correct, it was sent there on 10-30-18.

## 2018-11-09 NOTE — Telephone Encounter (Signed)
Patient of Bob's, has not been seen since 8/19.  90 day supply was sent in to another pharmacy 10 days ago

## 2018-11-09 NOTE — Telephone Encounter (Signed)
[facility] faxed refill request for the following medications:  olmesartan (BENICAR) 20 MG tablet   Last Rx: 10/30/2018 was sent to [place]. Not sure which pharmacy pt wanted it sent.  Please advise. Thanks TNP

## 2018-11-13 NOTE — Telephone Encounter (Signed)
Needs OV.  

## 2018-11-15 ENCOUNTER — Telehealth: Payer: Self-pay | Admitting: Family Medicine

## 2018-11-15 NOTE — Telephone Encounter (Signed)
Pt returned missed call from [place]. Please call pt back at [phone] (H).  Thanks, [employer]

## 2018-11-15 NOTE — Telephone Encounter (Signed)
LMTCB ED 

## 2018-11-15 NOTE — Telephone Encounter (Signed)
We received another fax from [place] requesting a refill for olmesartan (BENICAR) 20 MG tablet.  Looks like it was sent to [facility].  Please advise

## 2018-11-16 NOTE — Telephone Encounter (Signed)
Pt stated that his insurance wouldn't cover olmesartan (BENICAR) 20 MG tablet at [facility] and is requesting Rx be sent to [facility]. Please advise. Thanks TNP

## 2018-11-16 NOTE — Telephone Encounter (Signed)
Advised 

## 2018-11-17 ENCOUNTER — Telehealth: Payer: Self-pay | Admitting: Family Medicine

## 2018-11-17 MED ORDER — OLMESARTAN MEDOXOMIL 20 MG PO TABS: 20.0000 mg | ORAL_TABLET | Freq: Every day | ORAL | 0 refills | Status: DC

## 2018-11-17 NOTE — Telephone Encounter (Signed)
Pt's wife calling back on pt's olmesartan (BENICAR) 20 MG tablet. This medication needs to be called into the [facility].  Pt's insurance will only cover it if it is call into [facility].  This issue with the mediatation has been going on since July.  Please call Rx into asap:  [place], [place] [phone] (Phone) [phone] (Fax)   Thanks, [place]

## 2018-11-17 NOTE — Telephone Encounter (Signed)
This was sent into the pharmacy today (11/17/18) at 9:50am.

## 2018-12-04 ENCOUNTER — Ambulatory Visit (INDEPENDENT_AMBULATORY_CARE_PROVIDER_SITE_OTHER): Payer: Managed Care, Other (non HMO) | Admitting: Family Medicine

## 2018-12-04 ENCOUNTER — Other Ambulatory Visit: Payer: Self-pay

## 2018-12-04 ENCOUNTER — Encounter: Payer: Self-pay | Admitting: Family Medicine

## 2018-12-04 VITALS — BP 112/80 | HR 80 | Temp 97.9°F | Resp 16 | Ht 76.0 in | Wt 335.2 lb

## 2018-12-04 DIAGNOSIS — Z8719 Personal history of other diseases of the digestive system: Secondary | ICD-10-CM

## 2018-12-04 DIAGNOSIS — Z0000 Encounter for general adult medical examination without abnormal findings: Secondary | ICD-10-CM | POA: Diagnosis not present

## 2018-12-04 DIAGNOSIS — Z6841 Body Mass Index (BMI) 40.0 and over, adult: Secondary | ICD-10-CM

## 2018-12-04 DIAGNOSIS — I10 Essential (primary) hypertension: Secondary | ICD-10-CM

## 2018-12-04 DIAGNOSIS — Z125 Encounter for screening for malignant neoplasm of prostate: Secondary | ICD-10-CM

## 2018-12-04 DIAGNOSIS — M5489 Other dorsalgia: Secondary | ICD-10-CM

## 2018-12-04 LAB — POCT URINALYSIS DIPSTICK
Bilirubin, UA: NEGATIVE
Crystals: 0
Glucose, UA: NEGATIVE
Ketones, UA: NEGATIVE
Leukocytes, UA: NEGATIVE
Nitrite, UA: NEGATIVE
Protein, UA: POSITIVE — AB
Spec Grav, UA: >=1.03 — AB (ref 1.010–1.025)
Urobilinogen, UA: 0.2 E.U./dL
pH, UA: 5 (ref 5.0–8.0)

## 2018-12-04 NOTE — Progress Notes (Signed)
Patient: Steve Beck, Male    DOB: 1971-01-19, 48 y.o.   MRN: [id number] Visit Date: 12/04/2018  Today's Provider: Vernie Murders, PA   Chief Complaint  Patient presents with  . Annual Exam   Subjective:    Annual physical exam Steve Beck is a 48 y.o. male who presents today for health maintenance and complete physical. He feels fairly well, patient reports lower back pain on the left side that she describes as sharp and intermittent. Patient reports that back pain has been present for a week or more and states that he usually experiences pain after work. Patient denies symptoms of dysuria, frequency or hematuria . He reports no regular routine of exercising . He reports he is sleeping fairly well.  -----------------------------------------------------------------   Review of Systems  Constitutional: Negative.   HENT: Positive for dental problem.   Eyes: Negative.   Respiratory: Negative.   Cardiovascular: Negative.   Gastrointestinal: Negative.   Endocrine: Negative.   Genitourinary: Negative.   Musculoskeletal: Positive for arthralgias.  Allergic/Immunologic: Negative.   Neurological: Negative.   Hematological: Negative.   Psychiatric/Behavioral: Negative.     Social History He  reports that he has quit smoking. He has quit using smokeless tobacco.  His smokeless tobacco use included chew. He reports that he does not drink alcohol or use drugs. Social History   Socioeconomic History  . Marital status: Married    Spouse name: Butch Penny  . Number of children: 4  . Years of education: 49  . 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  Social Needs  . Financial resource strain: Not on file  . Food insecurity    Worry: Not on file    Inability: Not on file  . Transportation needs    Medical: Not on file    Non-medical: Not on file  Tobacco Use  .  Smoking status: Former Research scientist (life sciences)  . Smokeless tobacco: Former Systems developer    Types: Chew  . Tobacco comment: Smoked for 17 years - quit ~2006  Substance and Sexual Activity  . Alcohol use: No  . Drug use: No  . Sexual activity: Yes  Lifestyle  . Physical activity    Days per week: Not on file    Minutes per session: Not on file  . Stress: Not on file  Relationships  . Social Herbalist on phone: Not on file    Gets together: Not on file    Attends religious service: Not on file    Active member of club or organization: Not on file    Attends meetings of clubs or organizations: Not on file    Relationship status: Not on file  Other Topics Concern  . Not on file  Social History Narrative  . Not on file    Patient Active Problem List   Diagnosis Date Noted  . History of kidney stones 11/18/2017  . BMI 40.0-44.9, adult ([place]) 10/10/2015  . Crohn's disease ([place]) 10/17/2013  . HTN (hypertension) 03/23/2013    Past Surgical History:  Procedure Laterality Date  . COLONOSCOPY    . COLONOSCOPY WITH PROPOFOL N/A 03/17/2015   Procedure: COLONOSCOPY WITH PROPOFOL;  Surgeon: Josefine Class, MD;  Location: [facility] ENDOSCOPY;  Service: Endoscopy;  Laterality: N/A;  . LEFT HEART CATHETERIZATION WITH CORONARY ANGIOGRAM N/A 03/23/2013   Procedure: LEFT HEART CATHETERIZATION WITH  CORONARY ANGIOGRAM;  Surgeon: Larey Dresser, MD;  Location: [facility] CATH LAB;  Service: Cardiovascular;  Laterality: N/A;  . none      Family History  Family Status  Relation Name Status  . Father  Deceased  . Mother  Alive  . Sister  Alive  . Brother  Alive  . Other  (Not Specified)   His family history includes Cancer in his brother; Diabetes Mellitus II in an other family member; Esophageal cancer in his father; Healthy in his sister; Hypertension in his mother; Migraines in his mother; Pulmonary embolism in his father.     Allergies  Allergen Reactions  . Augmentin [Amoxicillin-Pot Clavulanate]      Previous Medications   CETIRIZINE (ZYRTEC) 10 MG TABLET    Take 10 mg by mouth daily.   CYANOCOBALAMIN (VITAMIN B-12 PO)    Take by mouth.   ESOMEPRAZOLE (NEXIUM) 40 MG CAPSULE    Take 40 mg by mouth daily at 12 noon.   FLUTICASONE (FLONASE) 50 MCG/ACT NASAL SPRAY    Place 2 sprays into both nostrils daily.   OLMESARTAN (BENICAR) 20 MG TABLET    Take 1 tablet (20 mg total) by mouth daily.    Patient Care Team: Margo Common, PA as PCP - General (Family Medicine)      Objective:   Vitals: BP 112/80   Pulse 80   Temp 97.9 F (36.6 C) (Oral)   Resp 16   Ht 6\' 4"  (1.93 m)   Wt (!) 335 lb 3.2 oz (152 kg)   SpO2 96%   BMI 40.80 kg/m   Wt Readings from Last 3 Encounters:  12/04/18 (!) 335 lb 3.2 oz (152 kg)  11/18/17 (!) 332 lb 12.8 oz (151 kg)  11/04/16 (!) 329 lb (149.2 kg)   Physical Exam Constitutional:      Appearance: He is well-developed.  HENT:     Head: Normocephalic and atraumatic.     Right Ear: External ear normal.     Left Ear: External ear normal.     Nose: Nose normal.  Eyes:     General:        Right eye: No discharge.     Conjunctiva/sclera: Conjunctivae normal.     Pupils: Pupils are equal, round, and reactive to light.  Neck:     Musculoskeletal: Normal range of motion and neck supple.     Thyroid: No thyromegaly.     Trachea: No tracheal deviation.  Cardiovascular:     Rate and Rhythm: Normal rate and regular rhythm.     Heart sounds: Normal heart sounds. No murmur.  Pulmonary:     Effort: Pulmonary effort is normal. No respiratory distress.     Breath sounds: Normal breath sounds. No wheezing or rales.  Chest:     Chest wall: No tenderness.  Abdominal:     General: There is no distension.     Palpations: Abdomen is soft. There is no mass.     Tenderness: There is no abdominal tenderness. There is no guarding or rebound.  Genitourinary:    Scrotum/Testes: Normal.     Prostate: Normal.     Rectum: Normal. Guaiac result negative.   Musculoskeletal: Normal range of motion.        General: No tenderness.  Lymphadenopathy:     Cervical: No cervical adenopathy.  Skin:    General: Skin is warm and dry.     Findings: No erythema or rash.  Neurological:  Mental Status: He is alert and oriented to person, place, and time.     Cranial Nerves: No cranial nerve deficit.     Motor: No abnormal muscle tone.     Coordination: Coordination normal.     Deep Tendon Reflexes: Reflexes are normal and symmetric. Reflexes normal.  Psychiatric:        Behavior: Behavior normal.        Thought Content: Thought content normal.        Judgment: Judgment normal.    Depression Screen PHQ 2/9 Scores 12/04/2018 11/18/2017 11/18/2017 11/04/2016  PHQ - 2 Score 0 0 0 1  PHQ- 9 Score 0 - 2 -    Assessment & Plan:     Routine Health Maintenance and Physical Exam  Exercise Activities and Dietary recommendations Goals   Recommend 30 minute exercise program 3-4 days a week. Should follow low fat calorie restricted diet to help with weight loss.      There is no immunization history on file for this patient.  Health Maintenance  Topic Date Due  . INFLUENZA VACCINE  11/11/2018  . TETANUS/TDAP  10/10/2021  . COLONOSCOPY  03/16/2025  . HIV Screening  Completed   Discussed health benefits of physical activity, and encouraged him to engage in regular exercise appropriate for his age and condition.   1. Annual physical exam Good general health. States he got a flu shot at [facility] on 12-02-18 (will try to get a copy of this record). Check routine labs and follow up as needed. - CBC with Differential/Platelet - Comprehensive metabolic panel - Lipid panel - TSH - POCT urinalysis dipstick  2. Essential hypertension Well controlled BP on the Benicar 20 mg qd without side effects. Recheck routine follow up labs. - CBC with Differential/Platelet - Comprehensive metabolic panel - Lipid panel  3. Hx of Crohn's disease  States he had colonoscopic exam by Dr. Alice Reichert June 2020 with normal report as a follow up of Crohn's Disease. Normal pathology report of biopsies. No significant symptoms or problems with BM's. Will recheck CBC and CMP. - CBC with Differential/Platelet - Comprehensive metabolic panel  4. BMI 40.0-44.9, adult ([facility]) Weight has gone up by 3 lbs in the past year. States he had it down to 306 with reducing intake. Recommend he get back on calorie reduction diet and regular exercise. Recheck labs to assess metabolic disorder. - CBC with Differential/Platelet - Comprehensive metabolic panel - Lipid panel - TSH  5. Other back pain, unspecified chronicity Some sharp pains in the left mid back over the past week. No hematuria but some blood on urine dipstick and several WBC's per hpf on microscopic exam. Has a history of kidney stones but does not feel this is the same pain. 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 May use moist heat, drink extra fluids and will get urine C&S to rule out infection. May need to follow up with urologist if pain persists or worsens to rule out new kidney stones. - PSA - POCT urinalysis dipstick - Urine Culture  6. Screening PSA (prostate specific antigen) No nocturia or hesitancy. Some non-hemolyzed blood on urine dipstick. Normal DRE without prostate enlargement or nodules. - PSA    -------------------------------------------------------------------- Fritzi Mandes Saesha Llerenas,acting as a scribe for [employer].,have documented all relevant documentation on the behalf of [employer],as directed by  [employer] while in the presence of [employer], [place].

## 2018-12-06 LAB — CBC WITH DIFFERENTIAL/PLATELET
Basophils Absolute: 0.1 10*3/uL (ref 0.0–0.2)
Basos: 1 %
EOS (ABSOLUTE): 0.2 10*3/uL (ref 0.0–0.4)
Eos: 3 %
Hematocrit: 41.1 % (ref 37.5–51.0)
Hemoglobin: 13.1 g/dL (ref 13.0–17.7)
Immature Grans (Abs): 0 10*3/uL (ref 0.0–0.1)
Immature Granulocytes: 0 %
Lymphocytes Absolute: 2.1 10*3/uL (ref 0.7–3.1)
Lymphs: 31 %
MCH: 27.6 pg (ref 26.6–33.0)
MCHC: 31.9 g/dL (ref 31.5–35.7)
MCV: 87 fL (ref 79–97)
Monocytes Absolute: 0.8 10*3/uL (ref 0.1–0.9)
Monocytes: 12 %
Neutrophils Absolute: 3.7 10*3/uL (ref 1.4–7.0)
Neutrophils: 53 %
Platelets: 317 10*3/uL (ref 150–450)
RBC: 4.75 x10E6/uL (ref 4.14–5.80)
RDW: 14.4 % (ref 11.6–15.4)
WBC: 6.9 10*3/uL (ref 3.4–10.8)

## 2018-12-06 LAB — LIPID PANEL
Chol/HDL Ratio: 4.3 ratio (ref 0.0–5.0)
Cholesterol, Total: 132 mg/dL (ref 100–199)
HDL: 31 mg/dL — ABNORMAL LOW (ref >39)
LDL Calculated: 59 mg/dL (ref 0–99)
Triglycerides: 209 mg/dL — ABNORMAL HIGH (ref 0–149)
VLDL Cholesterol Cal: 42 mg/dL — ABNORMAL HIGH (ref 5–40)

## 2018-12-06 LAB — COMPREHENSIVE METABOLIC PANEL
ALT: 26 IU/L (ref 0–44)
AST: 15 IU/L (ref 0–40)
Albumin/Globulin Ratio: 2 (ref 1.2–2.2)
Albumin: 4.5 g/dL (ref 4.0–5.0)
Alkaline Phosphatase: 78 IU/L (ref 39–117)
BUN/Creatinine Ratio: 23 — ABNORMAL HIGH (ref 9–20)
BUN: 15 mg/dL (ref 6–24)
Bilirubin Total: <0.2 mg/dL (ref 0.0–1.2)
CO2: 23 mmol/L (ref 20–29)
Calcium: 9.4 mg/dL (ref 8.7–10.2)
Chloride: 105 mmol/L (ref 96–106)
Creatinine, Ser: 0.66 mg/dL — ABNORMAL LOW (ref 0.76–1.27)
GFR calc Af Amer: 130 mL/min/{1.73_m2} (ref >59)
GFR calc non Af Amer: 113 mL/min/{1.73_m2} (ref >59)
Globulin, Total: 2.3 g/dL (ref 1.5–4.5)
Glucose: 110 mg/dL — ABNORMAL HIGH (ref 65–99)
Potassium: 4.6 mmol/L (ref 3.5–5.2)
Sodium: 143 mmol/L (ref 134–144)
Total Protein: 6.8 g/dL (ref 6.0–8.5)

## 2018-12-06 LAB — PSA: Prostate Specific Ag, Serum: 0.2 ng/mL (ref 0.0–4.0)

## 2018-12-06 LAB — TSH: TSH: 1.16 u[IU]/mL (ref 0.450–4.500)

## 2018-12-07 ENCOUNTER — Telehealth: Payer: Self-pay

## 2018-12-07 LAB — URINE CULTURE: Organism ID, Bacteria: NO GROWTH

## 2018-12-07 NOTE — Telephone Encounter (Signed)
-----   Message from Margo Common, [place] sent at 12/07/2018  1:46 PM EDT ----- Final report of urine culture is negative today. If having any urinary symptoms or persistent back discomfort, let us know.

## 2018-12-07 NOTE — Telephone Encounter (Signed)
Advised 

## 2018-12-07 NOTE — Telephone Encounter (Signed)
LMTCB ED 

## 2019-02-23 ENCOUNTER — Telehealth: Payer: Self-pay | Admitting: *Deleted

## 2019-02-23 DIAGNOSIS — I10 Essential (primary) hypertension: Secondary | ICD-10-CM

## 2019-02-26 MED ORDER — OLMESARTAN MEDOXOMIL 20 MG PO TABS: 20.0000 mg | ORAL_TABLET | Freq: Every day | ORAL | 0 refills | Status: DC

## 2019-02-27 ENCOUNTER — Ambulatory Visit (INDEPENDENT_AMBULATORY_CARE_PROVIDER_SITE_OTHER): Payer: Managed Care, Other (non HMO) | Admitting: Physician Assistant

## 2019-02-27 ENCOUNTER — Encounter: Payer: Self-pay | Admitting: Physician Assistant

## 2019-02-27 VITALS — Temp 97.6°F | Wt 330.0 lb

## 2019-02-27 DIAGNOSIS — B084 Enteroviral vesicular stomatitis with exanthem: Secondary | ICD-10-CM

## 2019-02-27 MED ORDER — FIRST-DUKES MOUTHWASH MT SUSP
5.0000 mL | Freq: Four times a day (QID) | OROMUCOSAL | 0 refills | Status: DC | PRN
Start: 2019-02-27 — End: 2021-01-22

## 2019-02-27 NOTE — Patient Instructions (Signed)
Hand, Foot, and Mouth Disease, Adult  Hand, foot, and mouth disease is a common viral illness. It happens mainly in children who are younger than 50 years old, but adolescents and adults may also get it. The illness often causes:   Sore throat.   Sores in the mouth.   Fever.   Rash on the hands and feet.  Usually, this condition is not serious. Most people get better within 1-2 weeks.  What are the causes?  This condition is usually caused by a group of viruses called enteroviruses. The disease can spread from person to person (is contagious). A person is most contagious during the first week of the illness. The infection spreads through direct contact with:   Nose discharge of an infected person.   Throat discharge of an infected person.   Stool (feces) of an infected person.  What are the signs or symptoms?  Symptoms of this condition include:   Small sores in the mouth. These may cause pain.   A rash on the hands and feet, and sometimes on the buttocks. The rash may also occur on the arms, legs, or other areas of the body. The rash may look like small red bumps or sores and may have blisters.   Fever.   Body aches or headaches.   Irritability.   Decreased appetite.  How is this diagnosed?  This condition can usually be diagnosed with a physical exam in which your health care provider will look at your rash and mouth sores. Tests are usually not needed. In some cases, a stool (feces) sample or a throat swab may be taken to check for the virus or for other infections.  How is this treated?  In most cases, no treatment is needed. People usually get better within 2 weeks without treatment. To help relieve pain or fever, your health care provider may recommend over-the-counter medicines such as ibuprofen or acetaminophen. To help relieve discomfort from mouth sores, your health care provider may recommend using:   Solutions that are rinsed in the mouth.   Pain-relieving gel that is applied to the sores  (topical gel).   Antacid medicine.  Follow these instructions at home:  Managing pain and discomfort   Rinse your mouth with a salt-water mixture 3-4 times a day or as needed. To make a salt-water mixture, completely dissolve -1 tsp of salt in 1 cup of warm water. This can help to reduce pain from the mouth sores. Your health care provider may also recommend other rinse solutions to treat mouth sores.   To relieve discomfort when you are eating:  ? Try combinations of foods to see what you can tolerate. Aim for a balanced diet.  ? Eat soft foods. These may be easier to swallow.  ? Avoid foods and drinks that are salty, spicy, or acidic.  ? Avoid alcohol.  ? Try cold food and drinks, such as water, milk, milkshakes, frozen ice pops, slushies, and sherbets. Low-calorie sport drinks are good choices for staying hydrated.  General instructions   Return to your normal activities as told by your health care provider. Ask your health care provider what activities are safe for you.   Take or apply over-the-counter and prescription medicines only as told by your health care provider.   Wash your hands often with soap and water. If soap and water are not available, use hand sanitizer.   Stay away from work, schools, or other group settings during the first few days of the   weeks.  You have pain that does not get better with medicine.  You feel very irritable.  You have trouble swallowing.  You develop sores or blisters on your lips or outside of your mouth.  You have a fever for more than 3 days. Get help right away if:  You develop signs of severe dehydration, such as: ? Decreased urination. This means urinating only very small amounts or urinating fewer than 3 times in a 24-hour period. ?  Urine that is very dark. ? Dry mouth, tongue, or lips. ? Decreased tears or sunken eyes. ? Dry skin. ? Rapid breathing. ? Decreased activity or being very sleepy. ? Pale skin. ? Fingertips taking longer than 2 seconds to turn pink after a gentle squeeze. ? Weight loss.  You have a severe headache.  You have a stiff neck.  You experience changes in your behavior.  You have chest pain.  You have trouble breathing. Summary  Hand, foot, and mouth disease is a common viral illness.  This disease can spread from person to person (is contagious).  The illness often causes a sore throat, sores in the mouth, fever, and a rash on the hands and feet.  Typically, no treatment is needed for this condition. People usually get better within 2 weeks without treatment.  Get help right away if you develop signs of severe dehydration. This information is not intended to replace advice given to you by your health care provider. Make sure you discuss any questions you have with your health care provider. Document Released: 05/17/2016 Document Revised: 07/20/2018 Document Reviewed: 05/17/2016 Elsevier Patient Education  2020 [employer].

## 2019-02-27 NOTE — Progress Notes (Signed)
Patient: Steve Beck Male    DOB: December 17, 1970   48 y.o.   MRN: [id number] Visit Date: 02/27/2019  Today's Provider: Mar Daring, PA-C   Chief Complaint  Patient presents with  . Mouth Lesions   Subjective:    Virtual Visit via Video Note  I connected with Steve Beck on 02/27/19 at  2:00 PM EST by a video enabled telemedicine application and verified that I am speaking with the correct person using two identifiers.  Location: Patient: Home Provider: [facility]   I discussed the limitations of evaluation and management by telemedicine and the availability of in person appointments. The patient expressed understanding and agreed to proceed.  HPI Patient C/O red blisters in mouth since Sunday. Patient reports chills and fever last night. Patient reports he has been using mouth wash to help with blisters. No recent antibiotic use over the last 2 months. He does have Crohn's disease and has seen his GI physician over the summer and has been in remission. His grandson was recently diagnosed with hand, foot, mouth disease on Friday.    Allergies  Allergen Reactions  . Augmentin [Amoxicillin-Pot Clavulanate]      Current Outpatient Medications:  .  cetirizine (ZYRTEC) 10 MG tablet, Take 10 mg by mouth daily., Disp: , Rfl:  .  Cyanocobalamin (VITAMIN B-12 PO), Take by mouth., Disp: , Rfl:  .  esomeprazole (NEXIUM) 40 MG capsule, Take 40 mg by mouth daily at 12 noon., Disp: , Rfl:  .  olmesartan (BENICAR) 20 MG tablet, Take 1 tablet (20 mg total) by mouth daily., Disp: 90 tablet, Rfl: 0  Review of Systems  Constitutional: Positive for chills, fatigue and fever.  HENT: Positive for mouth sores and sore throat.   Respiratory: Negative.   Cardiovascular: Negative.   Gastrointestinal: Negative.   Neurological: Negative.     Social History   Tobacco Use  . Smoking status: Former Research scientist (life sciences)  . Smokeless tobacco: Former Systems developer    Types: Chew  . Tobacco comment: Smoked for  17 years - quit ~2006  Substance Use Topics  . Alcohol use: No      Objective:   Temp 97.6 F (36.4 C) (Temporal)   Wt (!) 330 lb (149.7 kg)   BMI 40.17 kg/m  Vitals:   02/27/19 1347  Temp: 97.6 F (36.4 C)  TempSrc: Temporal  Weight: (!) 330 lb (149.7 kg)  Body mass index is 40.17 kg/m.   Physical Exam Vitals signs reviewed.  Constitutional:      General: He is not in acute distress. Pulmonary:     Effort: No respiratory distress.  Neurological:     Mental Status: He is alert.      No results found for any visits on 02/27/19.     Assessment & Plan     1. Hand, foot and mouth disease Suspect hand, foot, mouth since his grandson was recently diagnosed on Friday. He has mouth sores and fever/chills. Does not have rash yet. Discussed viral nature of this illness and expectant course to self resolve in 7-10 days. Will send in [facility] magic mouthwash to help with the mouth pain. Call if symptoms worsen or are not improving. - Diphenhyd-Hydrocort-Nystatin (FIRST-DUKES MOUTHWASH) SUSP; Use as directed 5 mLs in the mouth or throat 4 (four) times daily as needed.  Dispense: 237 mL; Refill: 0   I discussed the assessment and treatment plan with the patient. The patient was provided an opportunity to ask  questions and all were answered. The patient agreed with the plan and demonstrated an understanding of the instructions.   The patient was advised to call back or seek an in-person evaluation if the symptoms worsen or if the condition fails to improve as anticipated.  I provided 14 minutes of non-face-to-face time during this encounter.     Mar Daring, PA-C  [facility]

## 2019-03-01 ENCOUNTER — Other Ambulatory Visit: Payer: Self-pay

## 2019-03-01 DIAGNOSIS — B084 Enteroviral vesicular stomatitis with exanthem: Secondary | ICD-10-CM

## 2019-03-01 DIAGNOSIS — I10 Essential (primary) hypertension: Secondary | ICD-10-CM

## 2019-03-01 MED ORDER — OLMESARTAN MEDOXOMIL 20 MG PO TABS: 20.0000 mg | ORAL_TABLET | Freq: Every day | ORAL | 0 refills | Status: DC

## 2019-03-01 NOTE — Telephone Encounter (Signed)
Done

## 2019-03-01 NOTE — Telephone Encounter (Signed)
Patient needs medication sent to [facility].  Patient has not picked up medication from [facility].  Patient does not want any medication sent to pharmacy.  [place], [place] [phone] (Phone) [phone] (Fax)   Call back [phone]

## 2019-04-27 LAB — HM DIABETES EYE EXAM

## 2019-05-28 ENCOUNTER — Encounter: Payer: Self-pay | Admitting: Family Medicine

## 2019-05-28 ENCOUNTER — Other Ambulatory Visit: Payer: Self-pay | Admitting: Family Medicine

## 2019-05-28 DIAGNOSIS — I10 Essential (primary) hypertension: Secondary | ICD-10-CM

## 2019-05-28 MED ORDER — OLMESARTAN MEDOXOMIL 20 MG PO TABS: 20.0000 mg | ORAL_TABLET | Freq: Every day | ORAL | 2 refills | Status: DC

## 2019-12-31 ENCOUNTER — Other Ambulatory Visit: Payer: Self-pay

## 2019-12-31 ENCOUNTER — Ambulatory Visit (INDEPENDENT_AMBULATORY_CARE_PROVIDER_SITE_OTHER): Payer: Managed Care, Other (non HMO) | Admitting: Family Medicine

## 2019-12-31 ENCOUNTER — Encounter: Payer: Self-pay | Admitting: Family Medicine

## 2019-12-31 VITALS — BP 154/96 | HR 76 | Temp 98.2°F | Wt 350.0 lb

## 2019-12-31 DIAGNOSIS — Z6841 Body Mass Index (BMI) 40.0 and over, adult: Secondary | ICD-10-CM

## 2019-12-31 DIAGNOSIS — Z125 Encounter for screening for malignant neoplasm of prostate: Secondary | ICD-10-CM

## 2019-12-31 DIAGNOSIS — I10 Essential (primary) hypertension: Secondary | ICD-10-CM

## 2019-12-31 DIAGNOSIS — Z0000 Encounter for general adult medical examination without abnormal findings: Secondary | ICD-10-CM | POA: Diagnosis not present

## 2019-12-31 DIAGNOSIS — Z1159 Encounter for screening for other viral diseases: Secondary | ICD-10-CM

## 2019-12-31 DIAGNOSIS — Z8719 Personal history of other diseases of the digestive system: Secondary | ICD-10-CM | POA: Diagnosis not present

## 2019-12-31 DIAGNOSIS — R0683 Snoring: Secondary | ICD-10-CM

## 2019-12-31 LAB — POCT URINALYSIS DIPSTICK
Bilirubin, UA: NEGATIVE
Blood, UA: NEGATIVE
Glucose, UA: NEGATIVE
Ketones, UA: NEGATIVE
Leukocytes, UA: NEGATIVE
Nitrite, UA: NEGATIVE
Protein, UA: NEGATIVE
Spec Grav, UA: 1.01 (ref 1.010–1.025)
Urobilinogen, UA: 0.2 E.U./dL
pH, UA: 5 (ref 5.0–8.0)

## 2019-12-31 NOTE — Patient Instructions (Signed)
Sleep Apnea Sleep apnea is a condition in which breathing pauses or becomes shallow during sleep. Episodes of sleep apnea usually last 10 seconds or longer, and they may occur as many as 20 times an hour. Sleep apnea disrupts your sleep and keeps your body from getting the rest that it needs. This condition can increase your risk of certain health problems, including:  Heart attack.  Stroke.  Obesity.  Diabetes.  Heart failure.  Irregular heartbeat. What are the causes? There are three kinds of sleep apnea:  Obstructive sleep apnea. This kind is caused by a blocked or collapsed airway.  Central sleep apnea. This kind happens when the part of the brain that controls breathing does not send the correct signals to the muscles that control breathing.  Mixed sleep apnea. This is a combination of obstructive and central sleep apnea. The most common cause of this condition is a collapsed or blocked airway. An airway can collapse or become blocked if:  Your throat muscles are abnormally relaxed.  Your tongue and tonsils are larger than normal.  You are overweight.  Your airway is smaller than normal. What increases the risk? You are more likely to develop this condition if you:  Are overweight.  Smoke.  Have a smaller than normal airway.  Are elderly.  Are male.  Drink alcohol.  Take sedatives or tranquilizers.  Have a family history of sleep apnea. What are the signs or symptoms? Symptoms of this condition include:  Trouble staying asleep.  Daytime sleepiness and tiredness.  Irritability.  Loud snoring.  Morning headaches.  Trouble concentrating.  Forgetfulness.  Decreased interest in sex.  Unexplained sleepiness.  Mood swings.  Personality changes.  Feelings of depression.  Waking up often during the night to urinate.  Dry mouth.  Sore throat. How is this diagnosed? This condition may be diagnosed with:  A medical history.  A physical  exam.  A series of tests that are done while you are sleeping (sleep study). These tests are usually done in a sleep lab, but they may also be done at home. How is this treated? Treatment for this condition aims to restore normal breathing and to ease symptoms during sleep. It may involve managing health issues that can affect breathing, such as high blood pressure or obesity. Treatment may include:  Sleeping on your side.  Using a decongestant if you have nasal congestion.  Avoiding the use of depressants, including alcohol, sedatives, and narcotics.  Losing weight if you are overweight.  Making changes to your diet.  Quitting smoking.  Using a device to open your airway while you sleep, such as: ? An oral appliance. This is a custom-made mouthpiece that shifts your lower jaw forward. ? A continuous positive airway pressure (CPAP) device. This device blows air through a mask when you breathe out (exhale). ? A nasal expiratory positive airway pressure (EPAP) device. This device has valves that you put into each nostril. ? A bi-level positive airway pressure (BPAP) device. This device blows air through a mask when you breathe in (inhale) and breathe out (exhale).  Having surgery if other treatments do not work. During surgery, excess tissue is removed to create a wider airway. It is important to get treatment for sleep apnea. Without treatment, this condition can lead to:  High blood pressure.  Coronary artery disease.  In men, an inability to achieve or maintain an erection (impotence).  Reduced thinking abilities. Follow these instructions at home: Lifestyle  Make any lifestyle changes   that your health care provider recommends.  Eat a healthy, well-balanced diet.  Take steps to lose weight if you are overweight.  Avoid using depressants, including alcohol, sedatives, and narcotics.  Do not use any products that contain nicotine or tobacco, such as cigarettes,  e-cigarettes, and chewing tobacco. If you need help quitting, ask your health care provider. General instructions  Take over-the-counter and prescription medicines only as told by your health care provider.  If you were given a device to open your airway while you sleep, use it only as told by your health care provider.  If you are having surgery, make sure to tell your health care provider you have sleep apnea. You may need to bring your device with you.  Keep all follow-up visits as told by your health care provider. This is important. Contact a health care provider if:  The device that you received to open your airway during sleep is uncomfortable or does not seem to be working.  Your symptoms do not improve.  Your symptoms get worse. Get help right away if:  You develop: ? Chest pain. ? Shortness of breath. ? Discomfort in your back, arms, or stomach.  You have: ? Trouble speaking. ? Weakness on one side of your body. ? Drooping in your face. These symptoms may represent a serious problem that is an emergency. Do not wait to see if the symptoms will go away. Get medical help right away. Call your local emergency services (911 in the [place] Do not drive yourself to the hospital. Summary  Sleep apnea is a condition in which breathing pauses or becomes shallow during sleep.  The most common cause is a collapsed or blocked airway.  The goal of treatment is to restore normal breathing and to ease symptoms during sleep. This information is not intended to replace advice given to you by your health care provider. Make sure you discuss any questions you have with your health care provider. Document Revised: 09/13/2018 Document Reviewed: 11/22/2017 Elsevier Patient Education  [place] DASH stands for "Dietary Approaches to Stop Hypertension." The DASH eating plan is a healthy eating plan that has been shown to reduce high blood pressure (hypertension).  It may also reduce your risk for type 2 diabetes, heart disease, and stroke. The DASH eating plan may also help with weight loss. What are tips for following this plan?  General guidelines  Avoid eating more than 2,300 mg (milligrams) of salt (sodium) a day. If you have hypertension, you may need to reduce your sodium intake to 1,500 mg a day.  Limit alcohol intake to no more than 1 drink a day for nonpregnant women and 2 drinks a day for men. One drink equals 12 oz of beer, 5 oz of wine, or 1 oz of hard liquor.  Work with your health care provider to maintain a healthy body weight or to lose weight. Ask what an ideal weight is for you.  Get at least 30 minutes of exercise that causes your heart to beat faster (aerobic exercise) most days of the week. Activities may include walking, swimming, or biking.  Work with your health care provider or diet and nutrition specialist (dietitian) to adjust your eating plan to your individual calorie needs. Reading food labels   Check food labels for the amount of sodium per serving. Choose foods with less than 5 percent of the Daily Value of sodium. Generally, foods with less than 300 mg of sodium per serving  fit into this eating plan.  To find whole grains, look for the word "whole" as the first word in the ingredient list. Shopping  Buy products labeled as "low-sodium" or "no salt added."  Buy fresh foods. Avoid canned foods and premade or frozen meals. Cooking  Avoid adding salt when cooking. Use salt-free seasonings or herbs instead of table salt or sea salt. Check with your health care provider or pharmacist before using salt substitutes.  Do not fry foods. Cook foods using healthy methods such as baking, boiling, grilling, and broiling instead.  Cook with heart-healthy oils, such as olive, canola, soybean, or sunflower oil. Meal planning  Eat a balanced diet that includes: ? 5 or more servings of fruits and vegetables each day. At each  meal, try to fill half of your plate with fruits and vegetables. ? Up to 6-8 servings of whole grains each day. ? Less than 6 oz of lean meat, poultry, or fish each day. A 3-oz serving of meat is about the same size as a deck of cards. One egg equals 1 oz. ? 2 servings of low-fat dairy each day. ? A serving of nuts, seeds, or beans 5 times each week. ? Heart-healthy fats. Healthy fats called Omega-3 fatty acids are found in foods such as flaxseeds and coldwater fish, like sardines, salmon, and mackerel.  Limit how much you eat of the following: ? Canned or prepackaged foods. ? Food that is high in trans fat, such as fried foods. ? Food that is high in saturated fat, such as fatty meat. ? Sweets, desserts, sugary drinks, and other foods with added sugar. ? Full-fat dairy products.  Do not salt foods before eating.  Try to eat at least 2 vegetarian meals each week.  Eat more home-cooked food and less restaurant, buffet, and fast food.  When eating at a restaurant, ask that your food be prepared with less salt or no salt, if possible. What foods are recommended? The items listed may not be a complete list. Talk with your dietitian about what dietary choices are best for you. Grains Whole-grain or whole-wheat bread. Whole-grain or whole-wheat pasta. Brown rice. [place]. Bulgur. Whole-grain and low-sodium cereals. Pita bread. Low-fat, low-sodium crackers. Whole-wheat flour tortillas. Vegetables Fresh or frozen vegetables (raw, steamed, roasted, or grilled). Low-sodium or reduced-sodium tomato and vegetable juice. Low-sodium or reduced-sodium tomato sauce and tomato paste. Low-sodium or reduced-sodium canned vegetables. Fruits All fresh, dried, or frozen fruit. Canned fruit in natural juice (without added sugar). Meat and other protein foods Skinless chicken or [place]. Ground chicken or [place]. Pork with fat trimmed off. Fish and seafood. Egg whites. Dried beans, peas, or lentils.  Unsalted nuts, nut butters, and seeds. Unsalted canned beans. Lean cuts of beef with fat trimmed off. Low-sodium, lean deli meat. Dairy Low-fat (1%) or fat-free (skim) milk. Fat-free, low-fat, or reduced-fat cheeses. Nonfat, low-sodium ricotta or cottage cheese. Low-fat or nonfat yogurt. Low-fat, low-sodium cheese. Fats and oils Soft margarine without trans fats. Vegetable oil. Low-fat, reduced-fat, or light mayonnaise and salad dressings (reduced-sodium). Canola, safflower, olive, soybean, and sunflower oils. Avocado. Seasoning and other foods Herbs. Spices. Seasoning mixes without salt. Unsalted popcorn and pretzels. Fat-free sweets. What foods are not recommended? The items listed may not be a complete list. Talk with your dietitian about what dietary choices are best for you. Grains Baked goods made with fat, such as croissants, muffins, or some breads. Dry pasta or rice meal packs. Vegetables Creamed or fried vegetables. Vegetables in a  cheese sauce. Regular canned vegetables (not low-sodium or reduced-sodium). Regular canned tomato sauce and paste (not low-sodium or reduced-sodium). Regular tomato and vegetable juice (not low-sodium or reduced-sodium). Angie Fava. Olives. Fruits Canned fruit in a light or heavy syrup. Fried fruit. Fruit in cream or butter sauce. Meat and other protein foods Fatty cuts of meat. Ribs. Fried meat. Berniece Salines. Sausage. Bologna and other processed lunch meats. Salami. Fatback. Hotdogs. Bratwurst. Salted nuts and seeds. Canned beans with added salt. Canned or smoked fish. Whole eggs or egg yolks. Chicken or [place] with skin. Dairy Whole or 2% milk, cream, and half-and-half. Whole or full-fat cream cheese. Whole-fat or sweetened yogurt. Full-fat cheese. Nondairy creamers. Whipped toppings. Processed cheese and cheese spreads. Fats and oils Butter. Stick margarine. Lard. Shortening. Ghee. Bacon fat. Tropical oils, such as coconut, palm kernel, or palm oil. Seasoning and  other foods Salted popcorn and pretzels. Onion salt, garlic salt, seasoned salt, table salt, and sea salt. Worcestershire sauce. Tartar sauce. Barbecue sauce. Teriyaki sauce. Soy sauce, including reduced-sodium. Steak sauce. Canned and packaged gravies. Fish sauce. Oyster sauce. Cocktail sauce. Horseradish that you find on the shelf. Ketchup. Mustard. Meat flavorings and tenderizers. Bouillon cubes. Hot sauce and Tabasco sauce. Premade or packaged marinades. Premade or packaged taco seasonings. Relishes. Regular salad dressings. Where to find more information:  National Heart, Lung, and [place]: https://wilson-eaton.com/  American Heart Association: www.heart.org Summary  The DASH eating plan is a healthy eating plan that has been shown to reduce high blood pressure (hypertension). It may also reduce your risk for type 2 diabetes, heart disease, and stroke.  With the DASH eating plan, you should limit salt (sodium) intake to 2,300 mg a day. If you have hypertension, you may need to reduce your sodium intake to 1,500 mg a day.  When on the DASH eating plan, aim to eat more fresh fruits and vegetables, whole grains, lean proteins, low-fat dairy, and heart-healthy fats.  Work with your health care provider or diet and nutrition specialist (dietitian) to adjust your eating plan to your individual calorie needs. This information is not intended to replace advice given to you by your health care provider. Make sure you discuss any questions you have with your health care provider. Document Revised: 03/11/2017 Document Reviewed: 03/22/2016 Elsevier Patient Education  2020 [employer].
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
## 2020-01-01 LAB — CBC WITH DIFFERENTIAL/PLATELET
Basophils Absolute: 0.1 10*3/uL (ref 0.0–0.2)
Basos: 1 %
EOS (ABSOLUTE): 0.2 10*3/uL (ref 0.0–0.4)
Eos: 3 %
Hematocrit: 42.8 % (ref 37.5–51.0)
Hemoglobin: 13.9 g/dL (ref 13.0–17.7)
Immature Grans (Abs): 0 10*3/uL (ref 0.0–0.1)
Immature Granulocytes: 0 %
Lymphocytes Absolute: 1.9 10*3/uL (ref 0.7–3.1)
Lymphs: 30 %
MCH: 28.7 pg (ref 26.6–33.0)
MCHC: 32.5 g/dL (ref 31.5–35.7)
MCV: 88 fL (ref 79–97)
Monocytes Absolute: 0.7 10*3/uL (ref 0.1–0.9)
Monocytes: 11 %
Neutrophils Absolute: 3.5 10*3/uL (ref 1.4–7.0)
Neutrophils: 55 %
Platelets: 325 10*3/uL (ref 150–450)
RBC: 4.84 x10E6/uL (ref 4.14–5.80)
RDW: 13.8 % (ref 11.6–15.4)
WBC: 6.4 10*3/uL (ref 3.4–10.8)

## 2020-01-01 LAB — TSH: TSH: 1.07 u[IU]/mL (ref 0.450–4.500)

## 2020-01-01 LAB — COMPREHENSIVE METABOLIC PANEL
ALT: 36 IU/L (ref 0–44)
AST: 23 IU/L (ref 0–40)
Albumin/Globulin Ratio: 1.9 (ref 1.2–2.2)
Albumin: 4.6 g/dL (ref 3.8–4.9)
Alkaline Phosphatase: 84 IU/L (ref 44–121)
BUN/Creatinine Ratio: 12 (ref 9–20)
BUN: 8 mg/dL (ref 6–24)
Bilirubin Total: 0.2 mg/dL (ref 0.0–1.2)
CO2: 24 mmol/L (ref 20–29)
Calcium: 9.5 mg/dL (ref 8.7–10.2)
Chloride: 103 mmol/L (ref 96–106)
Creatinine, Ser: 0.68 mg/dL — ABNORMAL LOW (ref 0.76–1.27)
GFR calc Af Amer: 128 mL/min/{1.73_m2} (ref >59)
GFR calc non Af Amer: 111 mL/min/{1.73_m2} (ref >59)
Globulin, Total: 2.4 g/dL (ref 1.5–4.5)
Glucose: 94 mg/dL (ref 65–99)
Potassium: 4.6 mmol/L (ref 3.5–5.2)
Sodium: 144 mmol/L (ref 134–144)
Total Protein: 7 g/dL (ref 6.0–8.5)

## 2020-01-01 LAB — LIPID PANEL
Chol/HDL Ratio: 3.9 ratio (ref 0.0–5.0)
Cholesterol, Total: 137 mg/dL (ref 100–199)
HDL: 35 mg/dL — ABNORMAL LOW (ref >39)
LDL Chol Calc (NIH): 83 mg/dL (ref 0–99)
Triglycerides: 102 mg/dL (ref 0–149)
VLDL Cholesterol Cal: 19 mg/dL (ref 5–40)

## 2020-01-01 LAB — HEMOGLOBIN A1C
Est. average glucose Bld gHb Est-mCnc: 131 mg/dL
Hgb A1c MFr Bld: 6.2 % — ABNORMAL HIGH (ref 4.8–5.6)

## 2020-01-01 LAB — PSA: Prostate Specific Ag, Serum: 0.2 ng/mL (ref 0.0–4.0)

## 2020-01-01 LAB — HEPATITIS C ANTIBODY: Hep C Virus Ab: <0.1 s/co ratio (ref 0.0–0.9)

## 2020-02-25 ENCOUNTER — Encounter: Payer: Self-pay | Admitting: Family Medicine

## 2020-02-25 ENCOUNTER — Other Ambulatory Visit: Payer: Self-pay

## 2020-02-25 DIAGNOSIS — I10 Essential (primary) hypertension: Secondary | ICD-10-CM

## 2020-02-25 MED ORDER — OLMESARTAN MEDOXOMIL 20 MG PO TABS: 20.0000 mg | ORAL_TABLET | Freq: Every day | ORAL | 2 refills | Status: DC

## 2020-11-10 LAB — LIPID PANEL
Cholesterol: 148 (ref 0–200)
HDL: 34 — AB (ref 35–70)
LDL Cholesterol: 102
Triglycerides: 56 (ref 40–160)

## 2020-11-10 LAB — BASIC METABOLIC PANEL: Glucose: 93

## 2020-12-06 ENCOUNTER — Encounter: Payer: Self-pay | Admitting: Family Medicine

## 2020-12-06 DIAGNOSIS — I10 Essential (primary) hypertension: Secondary | ICD-10-CM

## 2020-12-08 MED ORDER — OLMESARTAN MEDOXOMIL 20 MG PO TABS: 20.0000 mg | ORAL_TABLET | Freq: Every day | ORAL | 2 refills | Status: DC

## 2021-01-20 ENCOUNTER — Encounter: Payer: Managed Care, Other (non HMO) | Admitting: Family Medicine

## 2021-01-22 ENCOUNTER — Other Ambulatory Visit: Payer: Self-pay

## 2021-01-22 ENCOUNTER — Encounter: Payer: Self-pay | Admitting: Family Medicine

## 2021-01-22 ENCOUNTER — Ambulatory Visit (INDEPENDENT_AMBULATORY_CARE_PROVIDER_SITE_OTHER): Payer: BC Managed Care – PPO | Admitting: Family Medicine

## 2021-01-22 VITALS — BP 142/86 | HR 67 | Temp 98.0°F | Resp 16 | Ht 75.0 in | Wt 354.7 lb

## 2021-01-22 DIAGNOSIS — R7303 Prediabetes: Secondary | ICD-10-CM | POA: Insufficient documentation

## 2021-01-22 DIAGNOSIS — R0683 Snoring: Secondary | ICD-10-CM | POA: Insufficient documentation

## 2021-01-22 DIAGNOSIS — Z125 Encounter for screening for malignant neoplasm of prostate: Secondary | ICD-10-CM | POA: Diagnosis not present

## 2021-01-22 DIAGNOSIS — Z23 Encounter for immunization: Secondary | ICD-10-CM | POA: Insufficient documentation

## 2021-01-22 DIAGNOSIS — L821 Other seborrheic keratosis: Secondary | ICD-10-CM | POA: Insufficient documentation

## 2021-01-22 DIAGNOSIS — I10 Essential (primary) hypertension: Secondary | ICD-10-CM | POA: Insufficient documentation

## 2021-01-22 DIAGNOSIS — N3943 Post-void dribbling: Secondary | ICD-10-CM

## 2021-01-22 DIAGNOSIS — K5090 Crohn's disease, unspecified, without complications: Secondary | ICD-10-CM

## 2021-01-22 DIAGNOSIS — Z0000 Encounter for general adult medical examination without abnormal findings: Secondary | ICD-10-CM

## 2021-01-22 DIAGNOSIS — N401 Enlarged prostate with lower urinary tract symptoms: Secondary | ICD-10-CM

## 2021-01-22 DIAGNOSIS — Z6841 Body Mass Index (BMI) 40.0 and over, adult: Secondary | ICD-10-CM

## 2021-01-22 MED ORDER — TAMSULOSIN HCL 0.4 MG PO CAPS: 0.4000 mg | ORAL_CAPSULE | Freq: Every day | ORAL | 3 refills | Status: DC

## 2021-01-22 MED ORDER — OLMESARTAN MEDOXOMIL 20 MG PO TABS: 20.0000 mg | ORAL_TABLET | Freq: Every day | ORAL | 3 refills | Status: DC

## 2021-01-22 NOTE — Progress Notes (Signed)
Complete physical exam   Patient: Steve Beck   DOB: 1971-04-09   50 y.o. Male  MRN: [id number] Visit Date: 01/22/2021  Today's healthcare provider: Gwyneth Sprout, FNP   Well visit, BP refill.  Up to date on dental cleaning; due for eye exam- every day glasses wears.  Works at [employer], recently took over 4/5 grandkids since the passing of his daughter d/t a drug overdose July 2021  Subjective     HPI  Steve Beck is a 50 y.o. male who presents today for a complete physical exam.  He reports consuming a general diet. The patient does not participate in regular exercise at present. He generally feels fairly well. He reports sleeping fairly well. He does have additional problems to discuss today, patient reports black spots on his back that he describes as itchy.  Last reported Colonoscopy- 03/17/15 Td/Tdap- patient reports in the last 9 years. [facility] down- unable to verify. Past Medical History:  Diagnosis Date   Crohn's disease ([place])    GERD (gastroesophageal reflux disease)    Hypertension    IBS (irritable bowel syndrome)    Obesity    Past Surgical History:  Procedure Laterality Date   COLONOSCOPY     COLONOSCOPY WITH PROPOFOL N/A 03/17/2015   Procedure: COLONOSCOPY WITH PROPOFOL;  Surgeon: Josefine Class, MD;  Location: [facility] ENDOSCOPY;  Service: Endoscopy;  Laterality: N/A;   LEFT HEART CATHETERIZATION WITH CORONARY ANGIOGRAM N/A 03/23/2013   Procedure: LEFT HEART CATHETERIZATION WITH CORONARY ANGIOGRAM;  Surgeon: Larey Dresser, MD;  Location: [facility] CATH LAB;  Service: Cardiovascular;  Laterality: N/A;   none     Social History   Socioeconomic History   Marital status: Married    Spouse name: Butch Penny   Number of children: 4   Years of education: 12   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  Tobacco Use   Smoking status: Former   Smokeless tobacco:  Former    Types: Chew   Tobacco comments:    Smoked for 17 years - quit ~2006  Vaping Use   Vaping Use: Never used  Substance and Sexual Activity   Alcohol use: No   Drug use: No   Sexual activity: Yes  Other Topics Concern   Not on file  Social History Narrative   Not on file   Social Determinants of Health   Financial Resource Strain: Not on file  Food Insecurity: Not on file  Transportation Needs: Not on file  Physical Activity: Not on file  Stress: Not on file  Social Connections: Not on file  Intimate Partner Violence: Not on file   Family Status  Relation Name Status   Father  Deceased   Mother  Alive   Sister  Burden   Other  (Not Specified)   Family History  Problem Relation Age of Onset   Esophageal cancer Father    Pulmonary embolism Father    Hypertension Mother        H/o heart murmur but no CAD   Migraines Mother    Healthy Sister    Cancer Brother        mouth   Diabetes Mellitus II Other    Allergies  Allergen Reactions   Augmentin [Amoxicillin-Pot Clavulanate]     Patient Care Team: Chrismon, Vickki Muff, PA-C (Inactive) as PCP -  General (Family Medicine)   Medications: Outpatient Medications Prior to Visit  Medication Sig   cetirizine (ZYRTEC) 10 MG tablet Take 10 mg by mouth daily.   Cyanocobalamin (VITAMIN B-12 PO) Take by mouth.   Diphenhyd-Hydrocort-Nystatin (FIRST-DUKES MOUTHWASH) SUSP Use as directed 5 mLs in the mouth or throat 4 (four) times daily as needed. (Patient not taking: Reported on 12/31/2019)   esomeprazole (NEXIUM) 40 MG capsule Take 40 mg by mouth daily at 12 noon.   olmesartan (BENICAR) 20 MG tablet Take 1 tablet (20 mg total) by mouth daily.   No facility-administered medications prior to visit.    Review of Systems    Objective    There were no vitals taken for this visit.   Physical Exam Vitals and nursing note reviewed.  Constitutional:      General: He is awake. He is not in acute  distress.    Appearance: Normal appearance. He is well-developed and well-groomed. He is obese. He is not ill-appearing, toxic-appearing or diaphoretic.  HENT:     Head: Normocephalic and atraumatic.     Jaw: There is normal jaw occlusion. No trismus, tenderness, swelling or pain on movement.     Salivary Glands: Right salivary gland is not diffusely enlarged or tender. Left salivary gland is not diffusely enlarged or tender.     Right Ear: Hearing, tympanic membrane, ear canal and external ear normal. There is no impacted cerumen.     Left Ear: Hearing, tympanic membrane, ear canal and external ear normal. There is no impacted cerumen.     Nose: Nose normal. No congestion or rhinorrhea.     Right Turbinates: Not enlarged, swollen or pale.     Left Turbinates: Not enlarged, swollen or pale.     Right Sinus: No maxillary sinus tenderness or frontal sinus tenderness.     Left Sinus: No maxillary sinus tenderness or frontal sinus tenderness.     Mouth/Throat:     Lips: Pink.     Mouth: Mucous membranes are moist. No injury, lacerations, oral lesions or angioedema.     Pharynx: Oropharynx is clear. Uvula midline. No pharyngeal swelling, oropharyngeal exudate or posterior oropharyngeal erythema.     Tonsils: No tonsillar exudate or tonsillar abscesses.  Eyes:     General: Lids are normal. Vision grossly intact. Gaze aligned appropriately.        Right eye: No discharge.        Left eye: No discharge.     Extraocular Movements: Extraocular movements intact.     Conjunctiva/sclera: Conjunctivae normal.     Pupils: Pupils are equal, round, and reactive to light.  Neck:     Thyroid: No thyroid mass, thyromegaly or thyroid tenderness.     Vascular: No carotid bruit.     Trachea: Trachea normal. No tracheal tenderness.  Cardiovascular:     Rate and Rhythm: Normal rate and regular rhythm.     Pulses: Normal pulses.          Carotid pulses are 2+ on the right side and 2+ on the left side.       Radial pulses are 2+ on the right side and 2+ on the left side.       Femoral pulses are 2+ on the right side and 2+ on the left side.      Popliteal pulses are 2+ on the right side and 2+ on the left side.       Dorsalis pedis pulses are 2+ on the right side  and 2+ on the left side.       Posterior tibial pulses are 2+ on the right side and 2+ on the left side.     Heart sounds: Normal heart sounds, S1 normal and S2 normal. No murmur heard.   No friction rub. No gallop.  Pulmonary:     Effort: Pulmonary effort is normal. No respiratory distress.     Breath sounds: Normal breath sounds and air entry. No stridor. No wheezing, rhonchi or rales.  Chest:     Chest wall: No tenderness.  Abdominal:     General: Bowel sounds are normal. There is no distension.     Palpations: Abdomen is soft. There is no mass.     Tenderness: There is no abdominal tenderness. There is no guarding or rebound.     Hernia: No hernia is present.     Comments: Adiposity   Genitourinary:    Comments: Exam deferred; denies complaints Musculoskeletal:        General: No swelling, tenderness, deformity or signs of injury. Normal range of motion.     Cervical back: Normal range of motion and neck supple. No rigidity or tenderness.     Right lower leg: No edema.     Left lower leg: No edema.  Lymphadenopathy:     Cervical: No cervical adenopathy.     Right cervical: No superficial, deep or posterior cervical adenopathy.    Left cervical: No superficial, deep or posterior cervical adenopathy.  Skin:    General: Skin is warm and dry.     Capillary Refill: Capillary refill takes less than 2 seconds.     Coloration: Skin is not jaundiced or pale.     Findings: No bruising, erythema, lesion or rash.          Comments: Multiple SK on upper back  Neurological:     General: No focal deficit present.     Mental Status: He is alert and oriented to person, place, and time. Mental status is at baseline.     GCS: GCS eye  subscore is 4. GCS verbal subscore is 5. GCS motor subscore is 6.     Cranial Nerves: Cranial nerves are intact.     Sensory: Sensation is intact. No sensory deficit.     Motor: Motor function is intact. No weakness.     Coordination: Coordination is intact.     Gait: Gait is intact.  Psychiatric:        Attention and Perception: Attention and perception normal.        Mood and Affect: Mood and affect normal.        Speech: Speech normal.        Behavior: Behavior normal. Behavior is cooperative.        Thought Content: Thought content normal.        Cognition and Memory: Cognition normal.        Judgment: Judgment normal.    Last depression screening scores PHQ 2/9 Scores 12/31/2019 12/04/2018 11/18/2017  PHQ - 2 Score 0 0 0  PHQ- 9 Score 6 0 -   Last fall risk screening Fall Risk  12/31/2019  Falls in the past year? 0  Number falls in past yr: 0  Injury with Fall? 0   Last Audit-C alcohol use screening Alcohol Use Disorder Test (AUDIT) 12/31/2019  1. How often do you have a drink containing alcohol? 0  2. How many drinks containing alcohol do you have on a typical day when  you are drinking? 0  3. How often do you have six or more drinks on one occasion? 0  AUDIT-C Score 0  Alcohol Brief Interventions/Follow-up AUDIT Score <7 follow-up not indicated   A score of 3 or more in women, and 4 or more in men indicates increased risk for alcohol abuse, EXCEPT if all of the points are from question 1   No results found for any visits on 01/22/21.  Assessment & Plan    Routine Health Maintenance and Physical Exam  Exercise Activities and Dietary recommendations  Goals   None     There is no immunization history for the selected administration types on file for this patient.  Health Maintenance  Topic Date Due   COVID-19 Vaccine (1) Never done   Zoster Vaccines- Shingrix (1 of 2) Never done   INFLUENZA VACCINE  Never done   TETANUS/TDAP  10/10/2021   COLONOSCOPY (Pts 45-31yrs  Insurance coverage will need to be confirmed)  03/16/2025   Hepatitis C Screening  Completed   HIV Screening  Completed   HPV VACCINES  Aged Out    Discussed health benefits of physical activity, and encouraged him to engage in regular exercise appropriate for his age and condition.  Problem List Items Addressed This Visit       Cardiovascular and Mediastinum   Essential hypertension    Encourage lifestyle changes Focus on mobility and stretching BP close to goal Continue medication Denies CP, SOB, DOE      Relevant Medications   olmesartan (BENICAR) 20 MG tablet   RESOLVED: HTN (hypertension)   Relevant Medications   olmesartan (BENICAR) 20 MG tablet     Musculoskeletal and Integument   Seborrheic keratosis    Various locations on back; wife concerns Made aware of benign nature Recommend skin coverage with clothing, limited sun exposure, sun protection- SPF        Genitourinary   Benign prostatic hyperplasia with post-void dribbling    Trial of Flomax Deferred DRE      Relevant Medications   tamsulosin (FLOMAX) 0.4 MG CAPS capsule     Other   BMI 40.0-44.9, adult ([place])    Discussed importance of healthy weight management Discussed diet and exercise       Crohn's disease ([place])    "in remission" per pt report       Snoring    Improvement in 'snoring' per pt Plan for referral for sleep study for worsens      Annual physical exam - Primary    Things to do to keep yourself healthy  - Exercise at least 30-45 minutes a day, 3-4 days a week.  - Eat a low-fat diet with lots of fruits and vegetables, up to 7-9 servings per day.  - Seatbelts can save your life. Wear them always.  - Smoke detectors on every level of your home, check batteries every year.  - Eye Doctor - have an eye exam every 1-2 years  - Safe sex - if you may be exposed to STDs, use a condom.  - Alcohol -  If you drink, do it moderately, less than 2 drinks per day.  - [place]. Choose someone to speak for you if you are not able.  - Depression is common in our stressful world.If you're feeling down or losing interest in things you normally enjoy, please come in for a visit.  - Violence - If anyone is threatening or hurting you, please call immediately.  Screening for prostate cancer    Defer DRE Plan for PSA       Relevant Orders   PSA   Prediabetes    Recommend blood draw Normal fasting blood glucose on work labs in 8/1      Relevant Orders   Hemoglobin A1c   Flu vaccine need    Provided today      Relevant Orders   Flu Vaccine QUAD 88mo+IM (Fluarix, Fluzone & Alfiuria Quad PF) (Completed)   Morbid obesity ([place])    htn Prediabetes Discussed importance of healthy weight management Discussed diet and exercise         No follow-ups on file.     Vonna Kotyk, FNP, have reviewed all documentation for this visit. The documentation on 01/22/21 for the exam, diagnosis, procedures, and orders are all accurate and complete.    Gwyneth Sprout, [place] [phone] (phone) [phone] (fax)  [place]

## 2021-01-22 NOTE — Assessment & Plan Note (Signed)
Improvement in 'snoring' per pt Plan for referral for sleep study for worsens

## 2021-01-22 NOTE — Assessment & Plan Note (Signed)
Recommend blood draw Normal fasting blood glucose on work labs in 8/1

## 2021-01-22 NOTE — Assessment & Plan Note (Signed)
htn Prediabetes Discussed importance of healthy weight management Discussed diet and exercise

## 2021-01-22 NOTE — Assessment & Plan Note (Signed)
Defer DRE Plan for PSA

## 2021-01-22 NOTE — Assessment & Plan Note (Signed)

## 2021-01-22 NOTE — Assessment & Plan Note (Signed)
Trial of Flomax Deferred DRE

## 2021-01-22 NOTE — Assessment & Plan Note (Signed)
Various locations on back; wife concerns Made aware of benign nature Recommend skin coverage with clothing, limited sun exposure, sun protection- SPF

## 2021-01-22 NOTE — Assessment & Plan Note (Signed)
"  in remission" per pt report

## 2021-01-22 NOTE — Assessment & Plan Note (Signed)
Provided today 

## 2021-01-22 NOTE — Assessment & Plan Note (Signed)
Encourage lifestyle changes Focus on mobility and stretching BP close to goal Continue medication Denies CP, SOB, DOE

## 2021-01-22 NOTE — Assessment & Plan Note (Signed)
Discussed importance of healthy weight management Discussed diet and exercise  

## 2021-01-23 ENCOUNTER — Encounter: Payer: Self-pay | Admitting: Family Medicine

## 2021-03-16 DIAGNOSIS — K1121 Acute sialoadenitis: Secondary | ICD-10-CM | POA: Diagnosis not present

## 2021-09-22 ENCOUNTER — Other Ambulatory Visit: Payer: Self-pay | Admitting: Family Medicine

## 2021-09-22 ENCOUNTER — Telehealth: Payer: Self-pay | Admitting: Family Medicine

## 2021-09-22 DIAGNOSIS — R0683 Snoring: Secondary | ICD-10-CM

## 2021-09-22 DIAGNOSIS — I10 Essential (primary) hypertension: Secondary | ICD-10-CM

## 2021-09-22 DIAGNOSIS — Z6841 Body Mass Index (BMI) 40.0 and over, adult: Secondary | ICD-10-CM

## 2021-09-22 NOTE — Telephone Encounter (Signed)
Pt would like a referral for a sleep study.  He states that it has been discussed at his office visits many times he has just put it off and would like to have it done now.

## 2021-09-22 NOTE — Telephone Encounter (Signed)
Called patient, left message.

## 2021-09-25 NOTE — Telephone Encounter (Signed)
Spoke with patient, he states his understanding.

## 2021-10-08 DIAGNOSIS — J0100 Acute maxillary sinusitis, unspecified: Secondary | ICD-10-CM | POA: Diagnosis not present

## 2021-10-08 DIAGNOSIS — R042 Hemoptysis: Secondary | ICD-10-CM | POA: Diagnosis not present

## 2021-10-15 ENCOUNTER — Telehealth: Payer: Self-pay

## 2021-10-15 DIAGNOSIS — Z6841 Body Mass Index (BMI) 40.0 and over, adult: Secondary | ICD-10-CM

## 2021-10-15 DIAGNOSIS — I10 Essential (primary) hypertension: Secondary | ICD-10-CM

## 2021-10-15 DIAGNOSIS — R0683 Snoring: Secondary | ICD-10-CM

## 2021-10-15 NOTE — Telephone Encounter (Signed)
Copied from [place] [id number]. Topic: Referral - Question >> Oct 15, 2021  1:11 PM Rudene Anda wrote: Reason for CRM: Pt wanted to know if he could get a referral sent in for a Sleep Study / Neurologist in [place], [facility] is booked out too far, please advise.

## 2021-10-19 NOTE — Telephone Encounter (Signed)
New referral has been placed for in lab sleep study. Referral coordinator aware and has sent referral to [facility].

## 2021-10-30 NOTE — Telephone Encounter (Signed)
Patient called in about  sleep study,at [facility]  he says they need the form signed and faxed back in order to see him.

## 2021-11-03 ENCOUNTER — Telehealth: Payer: Self-pay

## 2021-11-03 NOTE — Telephone Encounter (Signed)
Patient advised.

## 2021-11-03 NOTE — Telephone Encounter (Signed)
Copied from [place] [phone]. Topic: General - Other >> Nov 02, 2021  5:10 PM Ja-Kwan M wrote: Reason for CRM: Pt called for an update on whether the paperwork for sleep apnea was signed and sent back. Cb# [phone]

## 2021-11-03 NOTE — Telephone Encounter (Signed)
Do you know what form patient is referring to? Message dated 10/15/2021 mentions the "form"  was completed. Does anyone know the location of the form now? Was it sent to medical records to be faxed?

## 2021-11-12 ENCOUNTER — Ambulatory Visit: Payer: Self-pay

## 2021-11-12 ENCOUNTER — Ambulatory Visit: Payer: BC Managed Care – PPO | Admitting: Family Medicine

## 2021-11-12 ENCOUNTER — Encounter: Payer: Self-pay | Admitting: Family Medicine

## 2021-11-12 DIAGNOSIS — R7303 Prediabetes: Secondary | ICD-10-CM

## 2021-11-12 DIAGNOSIS — I10 Essential (primary) hypertension: Secondary | ICD-10-CM | POA: Diagnosis not present

## 2021-11-12 DIAGNOSIS — R55 Syncope and collapse: Secondary | ICD-10-CM

## 2021-11-12 NOTE — Patient Instructions (Signed)
The CDC recommends two doses of Shingrix (the new shingles vaccine) separated by 2 to 6 months for adults age 53 years and older. I recommend checking with your insurance plan regarding coverage for this vaccine.    

## 2021-11-12 NOTE — Assessment & Plan Note (Signed)
Chronic, worsening Up 20# in 10 month interval between appts Body mass index is 46.87 kg/m. Discussed importance of healthy weight management Discussed diet and exercise Patient denies concerns with weight gain or overall weight; has upcoming PSG for hx of snoring
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
## 2021-11-12 NOTE — Assessment & Plan Note (Addendum)
Chronic, remains elevated; reports home BP readings and drop in at work at lower 120s-130s/60/70s Denies missed medications Denies CP; endorses palpitations, worse when dehydrated Denies SOB/ DOE Denies low blood pressure/hypotension Denies vision changes; wears eye glasses. Has an appt today No LE Edema noted on exam Benicar 20 mg; refer to cards following use of Zio for report of palpitations Denies side effects of Rx Seek emergent care if you develop chest pain or chest pressure

## 2021-11-12 NOTE — Assessment & Plan Note (Signed)
Chronic, previously stable with diet/exercise Not on medication Repeat a1c given weight gain and hx of syncope  Continue to recommend balanced, lower carb meals. Smaller meal size, adding snacks. Choosing water as drink of choice and increasing purposeful exercise.

## 2021-11-13 ENCOUNTER — Other Ambulatory Visit: Payer: Self-pay | Admitting: Family Medicine

## 2021-11-13 DIAGNOSIS — E1169 Type 2 diabetes mellitus with other specified complication: Secondary | ICD-10-CM

## 2021-11-13 DIAGNOSIS — E1149 Type 2 diabetes mellitus with other diabetic neurological complication: Secondary | ICD-10-CM

## 2021-11-13 DIAGNOSIS — E1159 Type 2 diabetes mellitus with other circulatory complications: Secondary | ICD-10-CM

## 2021-11-13 LAB — COMPREHENSIVE METABOLIC PANEL
ALT: 36 IU/L (ref 0–44)
AST: 22 IU/L (ref 0–40)
Albumin/Globulin Ratio: 1.9 (ref 1.2–2.2)
Albumin: 4.3 g/dL (ref 3.8–4.9)
Alkaline Phosphatase: 79 IU/L (ref 44–121)
BUN/Creatinine Ratio: 17 (ref 9–20)
BUN: 11 mg/dL (ref 6–24)
Bilirubin Total: 0.3 mg/dL (ref 0.0–1.2)
CO2: 27 mmol/L (ref 20–29)
Calcium: 9.4 mg/dL (ref 8.7–10.2)
Chloride: 100 mmol/L (ref 96–106)
Creatinine, Ser: 0.66 mg/dL — ABNORMAL LOW (ref 0.76–1.27)
Globulin, Total: 2.3 g/dL (ref 1.5–4.5)
Glucose: 105 mg/dL — ABNORMAL HIGH (ref 70–99)
Potassium: 4.7 mmol/L (ref 3.5–5.2)
Sodium: 141 mmol/L (ref 134–144)
Total Protein: 6.6 g/dL (ref 6.0–8.5)
eGFR: 112 mL/min/{1.73_m2} (ref >59)

## 2021-11-13 LAB — TSH+T4F+T3FREE
Free T4: 0.99 ng/dL (ref 0.82–1.77)
T3, Free: 3 pg/mL (ref 2.0–4.4)
TSH: 1.2 u[IU]/mL (ref 0.450–4.500)

## 2021-11-13 LAB — LIPID PANEL
Chol/HDL Ratio: 3.7 ratio (ref 0.0–5.0)
Cholesterol, Total: 134 mg/dL (ref 100–199)
HDL: 36 mg/dL — ABNORMAL LOW (ref >39)
LDL Chol Calc (NIH): 81 mg/dL (ref 0–99)
Triglycerides: 88 mg/dL (ref 0–149)
VLDL Cholesterol Cal: 17 mg/dL (ref 5–40)

## 2021-11-13 LAB — CBC WITH DIFFERENTIAL/PLATELET
Basophils Absolute: 0.1 10*3/uL (ref 0.0–0.2)
Basos: 1 %
EOS (ABSOLUTE): 0.2 10*3/uL (ref 0.0–0.4)
Eos: 3 %
Hematocrit: 39.9 % (ref 37.5–51.0)
Hemoglobin: 13.2 g/dL (ref 13.0–17.7)
Immature Grans (Abs): 0 10*3/uL (ref 0.0–0.1)
Immature Granulocytes: 0 %
Lymphocytes Absolute: 1.6 10*3/uL (ref 0.7–3.1)
Lymphs: 21 %
MCH: 28.3 pg (ref 26.6–33.0)
MCHC: 33.1 g/dL (ref 31.5–35.7)
MCV: 85 fL (ref 79–97)
Monocytes Absolute: 0.8 10*3/uL (ref 0.1–0.9)
Monocytes: 11 %
Neutrophils Absolute: 4.9 10*3/uL (ref 1.4–7.0)
Neutrophils: 64 %
Platelets: 283 10*3/uL (ref 150–450)
RBC: 4.67 x10E6/uL (ref 4.14–5.80)
RDW: 14.1 % (ref 11.6–15.4)
WBC: 7.6 10*3/uL (ref 3.4–10.8)

## 2021-11-13 LAB — HEMOGLOBIN A1C
Est. average glucose Bld gHb Est-mCnc: 146 mg/dL
Hgb A1c MFr Bld: 6.7 % — ABNORMAL HIGH (ref 4.8–5.6)

## 2021-11-13 MED ORDER — OLMESARTAN MEDOXOMIL-HCTZ 40-25 MG PO TABS: 1.0000 | ORAL_TABLET | Freq: Every day | ORAL | 3 refills | Status: DC

## 2021-11-13 MED ORDER — METFORMIN HCL ER 750 MG PO TB24: 750.0000 mg | ORAL_TABLET | Freq: Two times a day (BID) | ORAL | 3 refills | Status: DC

## 2021-11-13 MED ORDER — ROSUVASTATIN CALCIUM 5 MG PO TABS: 5.0000 mg | ORAL_TABLET | Freq: Every day | ORAL | 3 refills | Status: DC

## 2021-11-13 NOTE — Progress Notes (Signed)
Elevation in A1c; now indicates diabetes. Recommend 750 mg Metformin XR BID to assist in further prevention of disease progression.  Good cholesterol remains low. The 10-year ASCVD risk score (Arnett DK, et al., 2019) is: 4.6%   Values used to calculate the score:     Age: 51 years     Sex: Male     Is Non-Hispanic African American: No     Diabetic: No     Tobacco smoker: No     Systolic Blood Pressure: 706 mmHg     Is BP treated: Yes     HDL Cholesterol: 36 mg/dL     Total Cholesterol: 134 mg/dL Heart attack and stroke risk is 5%  estimated within the next 10 years which is moderate. Recommend adding Crestor 5 mg to assist.  Recommend increase in diet of healthier fat choices- low fat meats, oils that are not solid at room temperature, nuts, seeds, fish- cod, halibut, salmon, and avocado. Exercise can also increase this number.  Supplemental omega 3's can be taken as well but are not as helpful as dietary/exercise changes.  Please let us know if you have any questions.  Thank you, Gwyneth Sprout, [address] [place], [place] [address] [phone] (phone) [phone] (fax) [place]

## 2021-11-16 NOTE — Telephone Encounter (Signed)
Signed form was faxed back on 10/30/21.

## 2021-11-23 DIAGNOSIS — N390 Urinary tract infection, site not specified: Secondary | ICD-10-CM | POA: Diagnosis not present

## 2021-11-26 ENCOUNTER — Ambulatory Visit: Payer: BC Managed Care – PPO

## 2021-11-26 DIAGNOSIS — G4733 Obstructive sleep apnea (adult) (pediatric): Secondary | ICD-10-CM | POA: Diagnosis not present

## 2021-12-10 ENCOUNTER — Ambulatory Visit (INDEPENDENT_AMBULATORY_CARE_PROVIDER_SITE_OTHER): Payer: BC Managed Care – PPO | Admitting: Neurology

## 2021-12-10 ENCOUNTER — Encounter: Payer: Self-pay | Admitting: Neurology

## 2021-12-10 DIAGNOSIS — G4730 Sleep apnea, unspecified: Secondary | ICD-10-CM | POA: Diagnosis not present
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
## 2021-12-10 NOTE — Patient Instructions (Signed)
Continue current medications Follow-up with your primary care doctor regarding your sleep test Return as needed

## 2021-12-31 ENCOUNTER — Ambulatory Visit (INDEPENDENT_AMBULATORY_CARE_PROVIDER_SITE_OTHER): Payer: BC Managed Care – PPO

## 2021-12-31 ENCOUNTER — Ambulatory Visit: Payer: BC Managed Care – PPO | Attending: Cardiology | Admitting: Cardiology

## 2021-12-31 ENCOUNTER — Encounter: Payer: Self-pay | Admitting: Cardiology

## 2021-12-31 VITALS — BP 146/95 | HR 71 | Ht 75.0 in | Wt 356.4 lb

## 2021-12-31 DIAGNOSIS — I10 Essential (primary) hypertension: Secondary | ICD-10-CM | POA: Diagnosis not present

## 2021-12-31 DIAGNOSIS — G4733 Obstructive sleep apnea (adult) (pediatric): Secondary | ICD-10-CM | POA: Diagnosis not present

## 2021-12-31 DIAGNOSIS — R55 Syncope and collapse: Secondary | ICD-10-CM | POA: Diagnosis not present

## 2021-12-31 DIAGNOSIS — R7303 Prediabetes: Secondary | ICD-10-CM

## 2021-12-31 NOTE — Patient Instructions (Addendum)
Medication Instructions:  - Your physician recommends that you continue on your current medications as directed. Please refer to the Current Medication list given to you today.  *If you need a refill on your cardiac medications before your next appointment, please call your pharmacy*   Lab Work: - none ordered  If you have labs (blood work) drawn today and your tests are completely normal, you will receive your results only by: [place] (if you have MyChart) OR A paper copy in the mail If you have any lab test that is abnormal or we need to change your treatment, we will call you to review the results.   Testing/Procedures:  1) Echocardiogram: - Your physician has requested that you have an echocardiogram. Echocardiography is a painless test that uses sound waves to create images of your heart. It provides your doctor with information about the size and shape of your heart and how well your heart's chambers and valves are working. This procedure takes approximately one hour. There are no restrictions for this procedure. There is a possibility that an IV may need to be started during your test to inject an image enhancing agent. This is done to obtain more optimal pictures of your heart. Therefore we ask that you do at least drink some water prior to coming in to hydrate your veins.    2) Carotid Ultrasound: - Your physician has requested that you have a carotid duplex. This test is an ultrasound of the carotid arteries in your neck. It looks at blood flow through these arteries that supply the brain with blood. Allow one hour for this exam. There are no restrictions or special instructions.   3) Heart Monitor:  Length of Wear: 14 days  Your monitor will be mailed to your home address within 3-5 business days. However, if you have not received your monitor after 5 business days please send [place] a MyChart message or call the office at [phone], so we may follow up on this for  you.   Your physician has recommended that you wear a Zio XT monitor.   This monitor is a medical device that records the heart's electrical activity. Doctors most often use these monitors to diagnose arrhythmias. Arrhythmias are problems with the speed or rhythm of the heartbeat. The monitor is a small device applied to your chest. You can wear one while you do your normal daily activities. While wearing this monitor if you have any symptoms to push the button and record what you felt. Once you have worn this monitor for the period of time provider prescribed (Usually 14 days), you will return the monitor device in the postage paid box. Once it is returned they will download the data collected and provide [place] with a report which the provider will then review and we will call you with those results. Important tips:  Avoid showering during the first 24 hours of wearing the monitor. Avoid excessive sweating to help maximize wear time. Do not submerge the device, no hot tubs, and no swimming pools. Keep any lotions or oils away from the patch. After 24 hours you may shower with the patch on. Take brief showers with your back facing the shower head.  Do not remove patch once it has been placed because that will interrupt data and decrease adhesive wear time. Push the button when you have any symptoms and write down what you were feeling. Once you have completed wearing your monitor, remove and place into box which  has postage paid and place in your outgoing mailbox.  If for some reason you have misplaced your box then call our office and we can provide another box and/or mail it off for you.     4) You have been referred to : [facility] (sleep disordered breathing)  The Pulmonary office will reach out to you with an appointment. However, if it has been more than 1 week and you have not heard from them, please call [phone] to follow up.    Follow-Up: At [facility], you  and your health needs are our priority.  As part of our continuing mission to provide you with exceptional heart care, we have created designated Provider Care Teams.  These Care Teams include your primary Cardiologist (physician) and Advanced Practice Providers (APPs -  Physician Assistants and Nurse Practitioners) who all work together to provide you with the care you need, when you need it.  We recommend signing up for the patient portal called "MyChart".  Sign up information is provided on this After Visit Summary.  MyChart is used to connect with patients for Virtual Visits (Telemedicine).  Patients are able to view lab/test results, encounter notes, upcoming appointments, etc.  Non-urgent messages can be sent to your provider as well.   To learn more about what you can do with MyChart, go to [email or web address].    Your next appointment:   6 week(s)  The format for your next appointment:   In Person  Provider:   You may see Glenetta Hew, MD or one of the following Advanced Practice Providers on your designated Care Team:   Murray Hodgkins, NP Christell Faith, PA-C Cadence Kathlen Mody, PA-C Gerrie Nordmann, NP    Other Instructions  Echocardiogram An echocardiogram is a test that uses sound waves (ultrasound) to produce images of the heart. Images from an echocardiogram can provide important information about: Heart size and shape. The size and thickness and movement of your heart's walls. Heart muscle function and strength. Heart valve function or if you have stenosis. Stenosis is when the heart valves are too narrow. If blood is flowing backward through the heart valves (regurgitation). A tumor or infectious growth around the heart valves. Areas of heart muscle that are not working well because of poor blood flow or injury from a heart attack. Aneurysm detection. An aneurysm is a weak or damaged part of an artery wall. The wall bulges out from the normal force of blood pumping through  the body. Tell a health care provider about: Any allergies you have. All medicines you are taking, including vitamins, herbs, eye drops, creams, and over-the-counter medicines. Any blood disorders you have. Any surgeries you have had. Any medical conditions you have. Whether you are pregnant or may be pregnant. What are the risks? Generally, this is a safe test. However, problems may occur, including an allergic reaction to dye (contrast) that may be used during the test. What happens before the test? No specific preparation is needed. You may eat and drink normally. What happens during the test?  You will take off your clothes from the waist up and put on a hospital gown. Electrodes or electrocardiogram (ECG)patches may be placed on your chest. The electrodes or patches are then connected to a device that monitors your heart rate and rhythm. You will lie down on a table for an ultrasound exam. A gel will be applied to your chest to help sound waves pass through your skin. A handheld device, called a  transducer, will be pressed against your chest and moved over your heart. The transducer produces sound waves that travel to your heart and bounce back (or "echo" back) to the transducer. These sound waves will be captured in real-time and changed into images of your heart that can be viewed on a video monitor. The images will be recorded on a computer and reviewed by your health care provider. You may be asked to change positions or hold your breath for a short time. This makes it easier to get different views or better views of your heart. In some cases, you may receive contrast through an IV in one of your veins. This can improve the quality of the pictures from your heart. The procedure may vary among health care providers and hospitals. What can I expect after the test? You may return to your normal, everyday life, including diet, activities, and medicines, unless your health care provider  tells you not to do that. Follow these instructions at home: It is up to you to get the results of your test. Ask your health care provider, or the department that is doing the test, when your results will be ready. Keep all follow-up visits. This is important. Summary An echocardiogram is a test that uses sound waves (ultrasound) to produce images of the heart. Images from an echocardiogram can provide important information about the size and shape of your heart, heart muscle function, heart valve function, and other possible heart problems. You do not need to do anything to prepare before this test. You may eat and drink normally. After the echocardiogram is completed, you may return to your normal, everyday life, unless your health care provider tells you not to do that. This information is not intended to replace advice given to you by your health care provider. Make sure you discuss any questions you have with your health care provider. Document Revised: 12/10/2020 Document Reviewed: 11/20/2019 Elsevier Patient Education  [place]

## 2021-12-31 NOTE — Assessment & Plan Note (Signed)
Blood pressure seems a little high with the initial BP readings, but he did have some mild orthostatic changes with for standing.  He is somewhat anxious.  For now would probably avoid adjusting his medications until we can be seen in follow-up.  Leery of a little bit of orthostatic changes.  Stressed importance of adequate hydration.  For now we will continue Benicar-HCTZ and will hold off on any additional medications for now.  Defer to PCP.

## 2021-12-31 NOTE — Progress Notes (Signed)
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 your heart and bounce back (or "echo" back) to the transducer. These sound waves will be captured in real-time and changed into images of your heart that can be viewed on a video monitor. The images will be recorded on a computer and reviewed by your health care provider. You may be asked to change positions or hold your breath for a short time. This makes it easier to get different views or better views of your heart. In some cases, you may receive contrast through an IV in one of your veins. This can improve the quality of the pictures from your heart. The procedure may vary among health care providers and hospitals. What can I expect after the test? You may return to your normal, everyday life, including diet, activities, and medicines, unless your health care provider tells you not to do that. Follow these instructions at home: It is up to you to get the results of your test. Ask  your health care provider, or the department that is doing the test, when your results will be ready. Keep all follow-up visits. This is important. Summary An echocardiogram is a test that uses sound waves (ultrasound) to produce images of the heart. Images from an echocardiogram can provide important information about the size and shape of your heart, heart muscle function, heart valve function, and other possible heart problems. You do not need to do anything to prepare before this test. You may eat and drink normally. After the echocardiogram is completed, you may return to your normal, everyday life, unless your health care provider tells  you not to do that. This information is not intended to replace advice given to you by your health care provider. Make sure you discuss any questions you have with your health care provider. Document Revised: 12/10/2020 Document Reviewed: 11/20/2019 Elsevier Patient Education  2023 Elsevier Inc.   Important Information About Sugar           Leonie Man, MD, MS Glenetta Hew, M.D., M.S. Interventional Cardiologist  [facility]   [address] [place],   [address] [phone]           Fax [phone]    Thank you for choosing [facility] in [place]!!
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
## 2021-12-31 NOTE — Assessment & Plan Note (Signed)
Is lost about 20 pounds according to scales from August.  He apparently put on that 20 pounds in the previous 10 months.  Stressed to the importance of weight loss.  He is trying to change his diet and try to get more exercise.  If he was able to get better sleep, he may then be able to also be more active.

## 2021-12-31 NOTE — Assessment & Plan Note (Signed)
Probably meets criteria for diabetes at this point with A1c of 6.7.  Was started on Glucophage. Given his body habitus and A1c of 6.7, I think given GLP-1 agonist would be an excellent option.  Could also help with weight loss.  (Ozempic or Mounjaro have best cardiovascular data)
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
## 2022-01-03 DIAGNOSIS — R55 Syncope and collapse: Secondary | ICD-10-CM

## 2022-01-05 ENCOUNTER — Telehealth: Payer: Self-pay

## 2022-01-05 NOTE — Telephone Encounter (Signed)
Copied from [place] [id number]. Topic: General - Inquiry >> Jan 05, 2022 12:04 PM Steve Beck wrote: Reason for CRM: Pt tried to do a sleep study Aug 17th but he got sick that night and could not do it.  He needs a new test set up but he would like to have it done at his home this time.  He needs done asap for his work  Steve Beck  [phone]  [phone]

## 2022-01-07 ENCOUNTER — Other Ambulatory Visit: Payer: Self-pay

## 2022-01-07 ENCOUNTER — Encounter: Payer: Self-pay | Admitting: Adult Health

## 2022-01-07 ENCOUNTER — Ambulatory Visit (INDEPENDENT_AMBULATORY_CARE_PROVIDER_SITE_OTHER): Payer: BC Managed Care – PPO | Admitting: Adult Health

## 2022-01-07 DIAGNOSIS — G4733 Obstructive sleep apnea (adult) (pediatric): Secondary | ICD-10-CM | POA: Diagnosis not present

## 2022-01-07 DIAGNOSIS — R0683 Snoring: Secondary | ICD-10-CM | POA: Diagnosis not present

## 2022-01-07 NOTE — Assessment & Plan Note (Signed)
Healthy weight loss discussed 
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
## 2022-01-07 NOTE — Addendum Note (Signed)
Addended by: Irine Seal B on: 01/07/2022 03:54 PM   Modules accepted: Orders
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
## 2022-01-07 NOTE — Patient Instructions (Signed)
Set up for home sleep study - urgent  Work on healthy weight loss  Healthy sleep regimen  Do not drive if sleepy  Use caution with sedating medications.  Follow up in 4-6 weeks to discuss sleep study results and treatment plan.

## 2022-01-08 ENCOUNTER — Telehealth: Payer: Self-pay | Admitting: Adult Health

## 2022-01-08 DIAGNOSIS — G4733 Obstructive sleep apnea (adult) (pediatric): Secondary | ICD-10-CM

## 2022-01-08 NOTE — Progress Notes (Signed)
Reviewed and agree with assessment/plan.   Chesley Mires, MD [facility] Pulmonary/Critical Care 01/08/2022, 8:39 AM Pager:  [phone]

## 2022-01-08 NOTE — Telephone Encounter (Signed)
Recent sleep study results noted with NPSG on November 26, 2021 showing very severe sleep apnea with AHI at 128 episodes an hour and SPO2 low at 66%.  Patient is to begin CPAP immediately. Order for new CPAP AutoSet 5 to 20 cm H2O.  Mask of choice enroll in [place].  Patient needs office visit in 6 weeks for download and consideration of CPAP titration study

## 2022-01-08 NOTE — Assessment & Plan Note (Signed)
Late add: Patient had thought that his recent sleep study did not have enough recordable data.  We did find final results from his sleep study that was done on November 26, 2021 showed 204 minutes of recordable data with very severe sleep apnea with a AHI at 128/hour and SPO2 low at 66%.  Called patient and reviewed sleep study results in detail.  We will begin CPAP immediately due to the severity of his sleep apnea.  We will consider CPAP titration study if indicated on return visit.  Begin CPAP AutoSet 5 to 20 cm H2O mask of choice.  Enroll in [place].

## 2022-01-10 HISTORY — PX: OTHER SURGICAL HISTORY: SHX169

## 2022-01-11 NOTE — Telephone Encounter (Signed)
Per Tammy Parrett pt is aware of order placement. Urgent order placed and PCCs made aware. Nothing further needed at this time.

## 2022-01-11 NOTE — Telephone Encounter (Signed)
ATC LVMTCB x 1  

## 2022-01-11 NOTE — Telephone Encounter (Signed)
Called patient to see if he still needs a referral for an at home sleep test. Unable to leave VM. Okay for PEC to advise.

## 2022-01-12 ENCOUNTER — Telehealth: Payer: Self-pay

## 2022-01-12 NOTE — Telephone Encounter (Signed)
Attempted to call patient regarding verification of order needed- left message on voice mail to call office

## 2022-01-12 NOTE — Telephone Encounter (Signed)
Patient called and asked if he needs a referral for sleep study. He says he spoke to several people already and he doesn't need the referral that everything was obtained when he had the sleep test and he has sleep apnea and waiting on the machine.

## 2022-01-12 NOTE — Telephone Encounter (Signed)
Copied from [place] [id number]. Topic: General - Other >> Jan 11, 2022  3:12 PM Tiffany B wrote: Reason for CRM: Called patient to see if he still needs a referral for an at home sleep test. Unable to leave VM. Okay for PEC to advise. >> Jan 12, 2022  9:54 AM Cyndi Bender wrote: Pt returned call and stated he does not need a referral for sleep test. Pt reports that he was informed that he has sleep apnea and an order for a machine was placed.

## 2022-01-20 DIAGNOSIS — R55 Syncope and collapse: Secondary | ICD-10-CM | POA: Diagnosis not present

## 2022-01-27 ENCOUNTER — Telehealth: Payer: Self-pay | Admitting: Cardiology

## 2022-01-27 NOTE — Telephone Encounter (Signed)
Attempted to call the patient. No answer- I left a message to please call back.  

## 2022-01-27 NOTE — Telephone Encounter (Signed)
Leonie Man, MD  01/24/2022 11:14 AM EDT     Reassuring monitor results:     Patch Wear Time:  11 days and 19 hours (2023-09-24T20:39:59-0400 to 2023-10-06T16:35:36-0400)   Predominant Underlying Rhythm: Sinus rhythm: HR range 52-118 bpm, Avg 74 bpm;   Occasional isolated premature atrial contractions PACs (1.5%) noted, with rare couplets (<1%)   Occasional isolated premature ventricular contractions (PVCs) (1.1%) noted, with rare (<1%) couplets and triplets; very short run of trigeminy   3 atrial Runs: Fastest 4 beats w/ HR Range 158-164 bpm, Avg 161 bpm, 1.6 sec; Longest 16 beats w/ HR Range 92-141 bpm, Avg 111 bpm, 9.1 sec   No Sustained Arrhythmias: Atrial Tachycardia (AT), Supraventricular Tachycardia (SVT), Atrial Fibrillation (A-Fib), Atrial Flutter (A-Flutter), Sustained Ventricular Tachycardia (VT   Overall normal study.  Nothing to be concerned about.  Nothing to explain symptoms at all.  9 seconds of premature beats would not cause passout spells. We will   Patient Triggers: Sinus rhythm with artifact -> only 1 event noted.     Glenetta Hew, MD

## 2022-01-28 NOTE — Telephone Encounter (Signed)
The patient has reviewed his results on MyChart with MD comments.

## 2022-02-04 ENCOUNTER — Ambulatory Visit: Payer: BC Managed Care – PPO | Attending: Cardiology

## 2022-02-04 ENCOUNTER — Ambulatory Visit (INDEPENDENT_AMBULATORY_CARE_PROVIDER_SITE_OTHER): Payer: BC Managed Care – PPO

## 2022-02-04 DIAGNOSIS — R55 Syncope and collapse: Secondary | ICD-10-CM

## 2022-02-04 HISTORY — PX: TRANSTHORACIC ECHOCARDIOGRAM: SHX275

## 2022-02-04 LAB — ECHOCARDIOGRAM COMPLETE
AR max vel: 4.98 cm2
AV Area VTI: 5.75 cm2
AV Area mean vel: 4.7 cm2
AV Mean grad: 4 mmHg
AV Peak grad: 6.9 mmHg
Ao pk vel: 1.31 m/s
Area-P 1/2: 3.24 cm2
S' Lateral: 3.7 cm
Single Plane A4C EF: 54.1 %

## 2022-02-04 MED ORDER — PERFLUTREN LIPID MICROSPHERE
1.0000 mL | INTRAVENOUS | Status: AC | PRN
  Administered 2022-02-04: 2 mL via INTRAVENOUS

## 2022-02-05 NOTE — Progress Notes (Unsigned)
Complete physical exam   Patient: Steve Beck   DOB: [birth year]   51 y.o. Male  MRN: [id number] Visit Date: 02/11/2022  Today's healthcare provider: Gwyneth Sprout, FNP  Re Introduced to nurse practitioner role and practice setting.  All questions answered.  Discussed provider/patient relationship and expectations.  I,Abdulah Iqbal J Cristofer Yaffe,acting as a scribe for Gwyneth Sprout, FNP.,have documented all relevant documentation on the behalf of Gwyneth Sprout, FNP,as directed by  Gwyneth Sprout, FNP while in the presence of Gwyneth Sprout, FNP.   Chief Complaint  Patient presents with   Annual Exam   Subjective    CHIRSTOPHER Beck is a 51 y.o. male who presents today for a complete physical exam.  He reports consuming a  low carb  diet. The patient has a physically strenuous job, but has no regular exercise apart from work.  He generally feels well. He reports sleeping well. He does not have additional problems to discuss today.  HPI   Past Medical History:  Diagnosis Date   Crohn's disease ([place])    GERD (gastroesophageal reflux disease)    Hypertension    IBS (irritable bowel syndrome)    Obesity    Past Surgical History:  Procedure Laterality Date   COLONOSCOPY     COLONOSCOPY WITH PROPOFOL N/A 03/17/2015   Procedure: COLONOSCOPY WITH PROPOFOL;  Surgeon: Josefine Class, MD;  Location: [facility] ENDOSCOPY;  Service: Endoscopy;  Laterality: N/A;   LEFT HEART CATHETERIZATION WITH CORONARY ANGIOGRAM N/A 03/23/2013   Procedure: LEFT HEART CATHETERIZATION WITH CORONARY ANGIOGRAM;  Surgeon: Larey Dresser, MD;  Location: [facility] CATH LAB;  Service: Cardiovascular;: Normal coronary arteries.  Normal LV systolic function.  Normal EDP.   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  Tobacco Use   Smoking status: Former    Passive exposure: Past   Smokeless tobacco: Former    Types: Chew    Quit date: 2006   Tobacco comments:    Smoked for 17 years - quit ~2006  Vaping Use   Vaping Use: Never used  Substance and Sexual Activity   Alcohol use: No   Drug use: No   Sexual activity: Yes  Other Topics Concern   Not on file  Social History Narrative   He is a former Nurse, children's.   Social Determinants of Health   Financial Resource Strain: Not on file  Food Insecurity: Not on file  Transportation Needs: Not on file  Physical Activity: Not on file  Stress: Not on file  Social Connections: Not on file  Intimate Partner Violence: Not on file   Family Status  Relation Name Status   Father  Deceased   Mother  Alive   Sister  [place]   Other  (Not Specified)   Family History  Problem Relation Age of Onset   Esophageal cancer Father    Pulmonary embolism Father    Hypertension Mother        H/o heart murmur but no CAD   Migraines Mother    Healthy Sister    Cancer Brother        mouth  Diabetes Mellitus II Other    Allergies  Allergen Reactions   Augmentin [Amoxicillin-Pot Clavulanate]     Patient Care Team: Gwyneth Sprout, FNP as PCP - General (Family Medicine)   Medications: Outpatient Medications Prior to Visit  Medication Sig   cetirizine (ZYRTEC) 10 MG tablet Take 10 mg by mouth daily.   cholecalciferol (VITAMIN D3) 25 MCG (1000 UNIT) tablet Take 1,000 Units by mouth daily.   Cyanocobalamin (VITAMIN B-12 PO) Take by mouth.   esomeprazole (NEXIUM) 40 MG capsule Take 40 mg by mouth daily at 12 noon.   metFORMIN (GLUCOPHAGE-XR) 750 MG 24 hr tablet Take 1 tablet (750 mg total) by mouth 2 (two) times daily with breakfast and lunch.   olmesartan-hydrochlorothiazide (BENICAR HCT) 40-25 MG tablet Take 1 tablet by mouth daily.   rosuvastatin (CRESTOR) 5 MG tablet Take 1 tablet (5  mg total) by mouth daily.   No facility-administered medications prior to visit.    Review of Systems  Musculoskeletal:  Positive for back pain.    Objective    BP 122/87 (BP Location: Right Arm, Patient Position: Sitting, Cuff Size: Large)   Pulse 68   Resp 16   Ht '6\' 3"'$  (1.905 m)   Wt (!) 350 lb (158.8 kg)   SpO2 98%   BMI 43.75 kg/m   Physical Exam Vitals and nursing note reviewed.  Constitutional:      General: He is awake. He is not in acute distress.    Appearance: Normal appearance. He is well-developed and well-groomed. He is obese. He is not ill-appearing, toxic-appearing or diaphoretic.  HENT:     Head: Normocephalic and atraumatic.     Jaw: There is normal jaw occlusion. No trismus, tenderness, swelling or pain on movement.     Salivary Glands: Right salivary gland is not diffusely enlarged or tender. Left salivary gland is not diffusely enlarged or tender.     Right Ear: Hearing, tympanic membrane, ear canal and external ear normal. There is no impacted cerumen.     Left Ear: Hearing, tympanic membrane, ear canal and external ear normal. There is no impacted cerumen.     Nose: Nose normal. No congestion or rhinorrhea.     Right Turbinates: Not enlarged, swollen or pale.     Left Turbinates: Not enlarged, swollen or pale.     Right Sinus: No maxillary sinus tenderness or frontal sinus tenderness.     Left Sinus: No maxillary sinus tenderness or frontal sinus tenderness.     Mouth/Throat:     Lips: Pink.     Mouth: Mucous membranes are moist. No injury, lacerations, oral lesions or angioedema.     Pharynx: Oropharynx is clear. Uvula midline. No pharyngeal swelling, oropharyngeal exudate or posterior oropharyngeal erythema.     Tonsils: No tonsillar exudate or tonsillar abscesses.  Eyes:     General: Lids are normal. Vision grossly intact. Gaze aligned appropriately.        Right eye: No discharge.        Left eye: No discharge.     Extraocular Movements:  Extraocular movements intact.     Conjunctiva/sclera: Conjunctivae normal.     Pupils: Pupils are equal, round, and reactive to light.  Neck:     Thyroid: No thyroid mass, thyromegaly or thyroid tenderness.     Vascular: No carotid bruit.     Trachea: Trachea normal. No tracheal tenderness.  Cardiovascular:     Rate and Rhythm: Normal rate and regular rhythm.  Pulses: Normal pulses.          Carotid pulses are 2+ on the right side and 2+ on the left side.      Radial pulses are 2+ on the right side and 2+ on the left side.       Femoral pulses are 2+ on the right side and 2+ on the left side.      Popliteal pulses are 2+ on the right side and 2+ on the left side.       Dorsalis pedis pulses are 2+ on the right side and 2+ on the left side.       Posterior tibial pulses are 2+ on the right side and 2+ on the left side.     Heart sounds: S1 normal and S2 normal. Heart sounds are distant. No murmur heard.    No friction rub. No gallop.     Comments: Distant heart and lung sounds in s/o obesity   Pulmonary:     Effort: Pulmonary effort is normal. No respiratory distress.     Breath sounds: Normal breath sounds and air entry. No stridor. No wheezing, rhonchi or rales.     Comments: Distant heart and lung sounds in s/o obesity  Chest:     Chest wall: No tenderness.  Abdominal:     General: Abdomen is flat. Bowel sounds are normal. There is no distension.     Palpations: Abdomen is soft. There is no mass.     Tenderness: There is no abdominal tenderness. There is no guarding or rebound.     Hernia: No hernia is present.  Genitourinary:    Comments: Exam deferred; endorses complaints of LUTS Musculoskeletal:        General: No swelling, tenderness, deformity or signs of injury. Normal range of motion.     Cervical back: Normal range of motion and neck supple. No rigidity or tenderness.     Right lower leg: No edema.     Left lower leg: No edema.  Lymphadenopathy:     Cervical: No  cervical adenopathy.     Right cervical: No superficial, deep or posterior cervical adenopathy.    Left cervical: No superficial, deep or posterior cervical adenopathy.  Skin:    General: Skin is warm and dry.     Capillary Refill: Capillary refill takes less than 2 seconds.     Coloration: Skin is not jaundiced or pale.     Findings: No bruising, erythema, lesion or rash.  Neurological:     General: No focal deficit present.     Mental Status: He is alert and oriented to person, place, and time. Mental status is at baseline.     GCS: GCS eye subscore is 4. GCS verbal subscore is 5. GCS motor subscore is 6.     Sensory: Sensation is intact. No sensory deficit.     Motor: Motor function is intact. No weakness.     Coordination: Coordination is intact.     Gait: Gait is intact.  Psychiatric:        Attention and Perception: Attention and perception normal.        Mood and Affect: Mood and affect normal.        Speech: Speech normal.        Behavior: Behavior normal. Behavior is cooperative.        Thought Content: Thought content normal.        Cognition and Memory: Cognition normal.  Judgment: Judgment normal.    Last depression screening scores    02/11/2022   10:31 AM 11/12/2021    8:49 AM 12/31/2019    8:27 AM  PHQ 2/9 Scores  PHQ - 2 Score 0 0 0  PHQ- 9 Score '3 9 6   '$ Last fall risk screening    02/11/2022   10:31 AM  [place] in the past year? 0  Number falls in past yr: 0  Injury with Fall? 0  Risk for fall due to : No Fall Risks  Follow up Falls evaluation completed   Last Audit-C alcohol use screening    02/11/2022   10:31 AM  Alcohol Use Disorder Test (AUDIT)  1. How often do you have a drink containing alcohol? 0  2. How many drinks containing alcohol do you have on a typical day when you are drinking? 0  3. How often do you have six or more drinks on one occasion? 0  AUDIT-C Score 0   A score of 3 or more in women, and 4 or more in men  indicates increased risk for alcohol abuse, EXCEPT if all of the points are from question 1   No results found for any visits on 02/11/22.  Assessment & Plan    Routine Health Maintenance and Physical Exam  Exercise Activities and Dietary recommendations  Goals   None     Immunization History  Administered Date(s) Administered   Dtap, Unspecified 11/04/2011   Influenza,inj,Quad PF,6+ Mos 01/22/2021   Tdap 02/11/2022    Health Maintenance  Topic Date Due   COVID-19 Vaccine (1) Never done   FOOT EXAM  Never done   Diabetic kidney evaluation - Urine ACR  Never done   Zoster Vaccines- Shingrix (1 of 2) Never done   OPHTHALMOLOGY EXAM  04/26/2020   INFLUENZA VACCINE  11/10/2021   HEMOGLOBIN A1C  05/15/2022   Diabetic kidney evaluation - GFR measurement  11/13/2022   COLONOSCOPY (Pts 45-62yr Insurance coverage will need to be confirmed)  03/16/2025   TETANUS/TDAP  02/12/2032   Hepatitis C Screening  Completed   HIV Screening  Completed   HPV VACCINES  Aged Out    Discussed health benefits of physical activity, and encouraged him to engage in regular exercise appropriate for his age and condition.  Problem List Items Addressed This Visit       Cardiovascular and Mediastinum   Hy[place]ension associated with diabetes ([place]    Chronic, borderline Goal <130/<80 with co-existing conditions Continue Benicar HCT at 40-25        Respiratory   Obstructive sleep apnea syndrome, severe    Chronic, currently untreated Should receive his home machine on 02/23/2022        Endocrine   RESOLVED: Controlled type 2 diabetes mellitus without complication, without long-term current use of insulin (HCC)   Type 2 diabetes mellitus with neurological complications (HCC)    Improving neuropathy with use of metformin 750 XR BID Continue to recommend balanced, lower carb meals. Smaller meal size, adding snacks. Choosing water as drink of choice and increasing purposeful exercise. Repeat  A1c; previously at goal <7%      Relevant Orders   Hemoglobin A1c     Genitourinary   Benign prostatic hyperplasia with post-void dribbling   Relevant Orders   PSA     Other   Morbid obesity (HCC) (Chronic)    Chronic, improved >25# lost in 3 months; congratulated Body mass index is 43.75 kg/m.  Associated with HTN, BPH and OSA-severe      Annual physical exam - Primary    Things to do to keep yourself healthy  - Exercise at least 30-45 minutes a day, 3-4 days a week.  - Eat a low-fat diet with lots of fruits and vegetables, up to 7-9 servings per day.  - Seatbelts can save your life. Wear them always.  - Smoke detectors on every level of your home, check batteries every year.  - Eye Doctor - have an eye exam every 1-2 years  - Safe sex - if you may be exposed to STDs, use a condom.  - Alcohol -  If you drink, do it moderately, less than 2 drinks per day.  - [place]. Choose someone to speak for you if you are not able.  - Depression is common in our stressful world.If you're feeling down or losing interest in things you normally enjoy, please come in for a visit.  - Violence - If anyone is threatening or hurting you, please call immediately.       Relevant Orders   CBC with Differential/Platelet   Comprehensive Metabolic Panel (CMET)   TSH + free T4   Lipid panel   Crohn's disease ([place])    Chronic, stable Denies concern for bleeding Reports due for repeat colon cancer screening; despite records stating due in 2026 for 10 year follow up       Need for tetanus, diphtheria, and acellular pertussis (Tdap) vaccine    Repeat every decade and PRN      Relevant Orders   Tdap vaccine greater than or equal to 7yo IM (Completed)   Screening for thyroid disorder    Complete thyroid screening given morbid obesity       Relevant Orders   TSH + free T4   RESOLVED: Screening PSA (prostate specific antigen)   Return in about 6 months (around 08/12/2022)  for chonic disease management.    Vonna Kotyk, FNP, have reviewed all documentation for this visit. The documentation on 02/11/22 for the exam, diagnosis, procedures, and orders are all accurate and complete.  Gwyneth Sprout, [place] [phone] (phone) [phone] (fax)  [place]

## 2022-02-09 ENCOUNTER — Telehealth: Payer: Self-pay | Admitting: Cardiology

## 2022-02-09 NOTE — Telephone Encounter (Signed)
1) Echo results: Leonie Man, MD  02/06/2022  2:41 PM EDT     Echocardiogram results: Normal left ventricular function with ejection fraction of 55%.  Normal ranges 50 to 70%.  Abnormal relaxation-grade 1 diastolic dysfunction (this tends to be related to high blood pressure)-> this correlates with moderate left atrial dilation indicating high blood pressures in the left ventricle. Unfortunately we were not able to get a good signal to assess the blood pressures in the lungs -> however, the pressures in the right atrium are not elevated indicating that it must not be that high.   Normal valves.   Overall, this could contribute to some shortness of breath, but would not be associate with passing out. Lack of significantly elevated pressures in the right side would suggest that sleep apnea has not yet had a notable impact on the heart.    Glenetta Hew, MD        2) Carotid US results: Leonie Man, MD  02/06/2022  2:43 PM EDT     Carotid Dopplers show normal flow in the carotid arteries, the clavian arteries and vertebral arteries.   Normal study.  Nothing to suggest a reason for passing out.   Glenetta Hew, MD

## 2022-02-09 NOTE — Telephone Encounter (Signed)
Attempted to call the patient at his Primary #. No answer- I left a message to please call back.

## 2022-02-10 ENCOUNTER — Encounter: Payer: BC Managed Care – PPO | Admitting: Family Medicine

## 2022-02-11 ENCOUNTER — Ambulatory Visit (INDEPENDENT_AMBULATORY_CARE_PROVIDER_SITE_OTHER): Payer: BC Managed Care – PPO | Admitting: Family Medicine

## 2022-02-11 ENCOUNTER — Encounter: Payer: Self-pay | Admitting: Family Medicine

## 2022-02-11 VITALS — BP 122/87 | HR 68 | Resp 16 | Ht 75.0 in | Wt 350.0 lb

## 2022-02-11 DIAGNOSIS — Z23 Encounter for immunization: Secondary | ICD-10-CM

## 2022-02-11 DIAGNOSIS — E119 Type 2 diabetes mellitus without complications: Secondary | ICD-10-CM | POA: Diagnosis not present

## 2022-02-11 DIAGNOSIS — G4733 Obstructive sleep apnea (adult) (pediatric): Secondary | ICD-10-CM

## 2022-02-11 DIAGNOSIS — Z125 Encounter for screening for malignant neoplasm of prostate: Secondary | ICD-10-CM

## 2022-02-11 DIAGNOSIS — Z0000 Encounter for general adult medical examination without abnormal findings: Secondary | ICD-10-CM | POA: Diagnosis not present

## 2022-02-11 DIAGNOSIS — I152 Hypertension secondary to endocrine disorders: Secondary | ICD-10-CM

## 2022-02-11 DIAGNOSIS — Z1329 Encounter for screening for other suspected endocrine disorder: Secondary | ICD-10-CM

## 2022-02-11 DIAGNOSIS — N401 Enlarged prostate with lower urinary tract symptoms: Secondary | ICD-10-CM

## 2022-02-11 DIAGNOSIS — N3943 Post-void dribbling: Secondary | ICD-10-CM

## 2022-02-11 DIAGNOSIS — K5090 Crohn's disease, unspecified, without complications: Secondary | ICD-10-CM

## 2022-02-11 DIAGNOSIS — E1149 Type 2 diabetes mellitus with other diabetic neurological complication: Secondary | ICD-10-CM

## 2022-02-11 DIAGNOSIS — E1159 Type 2 diabetes mellitus with other circulatory complications: Secondary | ICD-10-CM | POA: Insufficient documentation

## 2022-02-11 NOTE — Assessment & Plan Note (Signed)
Chronic, borderline Goal <130/<80 with co-existing conditions Continue Benicar HCT at 40-25

## 2022-02-11 NOTE — Telephone Encounter (Signed)
I spoke with the patient regarding his echo & carotid US results.  He voices understanding of these results and is agreeable.  I inquired if his sleep apnea is being treated and he advised her is supposed to pick up his machine on 11/14.

## 2022-02-11 NOTE — Assessment & Plan Note (Signed)
Complete thyroid screening given morbid obesity

## 2022-02-11 NOTE — Assessment & Plan Note (Signed)
Repeat every decade and PRN

## 2022-02-11 NOTE — Assessment & Plan Note (Signed)
Chronic, currently untreated Should receive his home machine on 02/23/2022

## 2022-02-11 NOTE — Assessment & Plan Note (Signed)
Improving neuropathy with use of metformin 750 XR BID Continue to recommend balanced, lower carb meals. Smaller meal size, adding snacks. Choosing water as drink of choice and increasing purposeful exercise. Repeat A1c; previously at goal <7%

## 2022-02-11 NOTE — Assessment & Plan Note (Signed)
Chronic, improved >25# lost in 3 months; congratulated Body mass index is 43.75 kg/m. Associated with HTN, BPH and OSA-severe

## 2022-02-11 NOTE — Assessment & Plan Note (Signed)

## 2022-02-11 NOTE — Assessment & Plan Note (Signed)
Chronic, stable Denies concern for bleeding Reports due for repeat colon cancer screening; despite records stating due in 2026 for 10 year follow up

## 2022-02-12 LAB — COMPREHENSIVE METABOLIC PANEL
ALT: 28 IU/L (ref 0–44)
AST: 19 IU/L (ref 0–40)
Albumin/Globulin Ratio: 1.9 (ref 1.2–2.2)
Albumin: 4.6 g/dL (ref 3.8–4.9)
Alkaline Phosphatase: 73 IU/L (ref 44–121)
BUN/Creatinine Ratio: 25 — ABNORMAL HIGH (ref 9–20)
BUN: 16 mg/dL (ref 6–24)
Bilirubin Total: 0.4 mg/dL (ref 0.0–1.2)
CO2: 28 mmol/L (ref 20–29)
Calcium: 9.7 mg/dL (ref 8.7–10.2)
Chloride: 101 mmol/L (ref 96–106)
Creatinine, Ser: 0.65 mg/dL — ABNORMAL LOW (ref 0.76–1.27)
Globulin, Total: 2.4 g/dL (ref 1.5–4.5)
Glucose: 91 mg/dL (ref 70–99)
Potassium: 4.2 mmol/L (ref 3.5–5.2)
Sodium: 142 mmol/L (ref 134–144)
Total Protein: 7 g/dL (ref 6.0–8.5)
eGFR: 113 mL/min/{1.73_m2} (ref >59)

## 2022-02-12 LAB — CBC WITH DIFFERENTIAL/PLATELET
Basophils Absolute: 0.1 10*3/uL (ref 0.0–0.2)
Basos: 1 %
EOS (ABSOLUTE): 0.2 10*3/uL (ref 0.0–0.4)
Eos: 2 %
Hematocrit: 41.7 % (ref 37.5–51.0)
Hemoglobin: 13.7 g/dL (ref 13.0–17.7)
Immature Grans (Abs): 0 10*3/uL (ref 0.0–0.1)
Immature Granulocytes: 0 %
Lymphocytes Absolute: 2 10*3/uL (ref 0.7–3.1)
Lymphs: 27 %
MCH: 28.1 pg (ref 26.6–33.0)
MCHC: 32.9 g/dL (ref 31.5–35.7)
MCV: 86 fL (ref 79–97)
Monocytes Absolute: 0.8 10*3/uL (ref 0.1–0.9)
Monocytes: 11 %
Neutrophils Absolute: 4.5 10*3/uL (ref 1.4–7.0)
Neutrophils: 59 %
Platelets: 321 10*3/uL (ref 150–450)
RBC: 4.87 x10E6/uL (ref 4.14–5.80)
RDW: 13.9 % (ref 11.6–15.4)
WBC: 7.6 10*3/uL (ref 3.4–10.8)

## 2022-02-12 LAB — LIPID PANEL
Chol/HDL Ratio: 3 ratio (ref 0.0–5.0)
Cholesterol, Total: 93 mg/dL — ABNORMAL LOW (ref 100–199)
HDL: 31 mg/dL — ABNORMAL LOW (ref >39)
LDL Chol Calc (NIH): 41 mg/dL (ref 0–99)
Triglycerides: 110 mg/dL (ref 0–149)
VLDL Cholesterol Cal: 21 mg/dL (ref 5–40)

## 2022-02-12 LAB — PSA: Prostate Specific Ag, Serum: 0.4 ng/mL (ref 0.0–4.0)

## 2022-02-12 LAB — HEMOGLOBIN A1C
Est. average glucose Bld gHb Est-mCnc: 134 mg/dL
Hgb A1c MFr Bld: 6.3 % — ABNORMAL HIGH (ref 4.8–5.6)

## 2022-02-12 LAB — TSH+FREE T4
Free T4: 1.11 ng/dL (ref 0.82–1.77)
TSH: 1.05 u[IU]/mL (ref 0.450–4.500)

## 2022-02-12 NOTE — Progress Notes (Signed)
A1c is better controlled with use of metformin; Continue to recommend balanced, lower carb meals. Smaller meal size, adding snacks. Choosing water as drink of choice and increasing purposeful exercise.  Cholesterol has also improved; however, good/HDL is low. Heart healthy diet and purposeful exercise can assist.  Gwyneth Sprout, [address] [place], [place] [address] [phone] (phone) [phone] (fax) [place]

## 2022-02-25 ENCOUNTER — Ambulatory Visit: Payer: BC Managed Care – PPO | Attending: Cardiology | Admitting: Cardiology

## 2022-02-25 ENCOUNTER — Encounter: Payer: Self-pay | Admitting: Cardiology

## 2022-02-25 VITALS — BP 150/100 | HR 72 | Ht 75.0 in | Wt 346.0 lb

## 2022-02-25 DIAGNOSIS — I152 Hypertension secondary to endocrine disorders: Secondary | ICD-10-CM

## 2022-02-25 DIAGNOSIS — E1149 Type 2 diabetes mellitus with other diabetic neurological complication: Secondary | ICD-10-CM

## 2022-02-25 DIAGNOSIS — Z041 Encounter for examination and observation following transport accident: Secondary | ICD-10-CM

## 2022-02-25 DIAGNOSIS — G4733 Obstructive sleep apnea (adult) (pediatric): Secondary | ICD-10-CM | POA: Diagnosis not present

## 2022-02-25 DIAGNOSIS — E1159 Type 2 diabetes mellitus with other circulatory complications: Secondary | ICD-10-CM

## 2022-02-25 NOTE — Patient Instructions (Signed)
Medication Instructions:  - Your physician recommends that you continue on your current medications as directed. Please refer to the Current Medication list given to you today.  *If you need a refill on your cardiac medications before your next appointment, please call your pharmacy*   Lab Work: - none ordered  If you have labs (blood work) drawn today and your tests are completely normal, you will receive your results only by: [place] (if you have MyChart) OR A paper copy in the mail If you have any lab test that is abnormal or we need to change your treatment, we will call you to review the results.   Testing/Procedures: - none ordered   Follow-Up: At [facility], you and your health needs are our priority.  As part of our continuing mission to provide you with exceptional heart care, we have created designated Provider Care Teams.  These Care Teams include your primary Cardiologist (physician) and Advanced Practice Providers (APPs -  Physician Assistants and Nurse Practitioners) who all work together to provide you with the care you need, when you need it.  We recommend signing up for the patient portal called "MyChart".  Sign up information is provided on this After Visit Summary.  MyChart is used to connect with patients for Virtual Visits (Telemedicine).  Patients are able to view lab/test results, encounter notes, upcoming appointments, etc.  Non-urgent messages can be sent to your provider as well.   To learn more about what you can do with MyChart, go to [email or web address].    Your next appointment:   As needed   The format for your next appointment:   In Person  Provider:   Glenetta Hew, MD    Other Instructions N/a  Important Information About Sugar

## 2022-02-25 NOTE — Assessment & Plan Note (Addendum)
Blood pressure is little high today, but he tells me that he has been much better of late.  He said actually this morning was 128/70.  We will continue to monitor.  Low threshold to titrate up BP meds by potentially adding dihydropyridine calcium channel blocker.  Overall his blood sugars are probably doing much better with his weight loss.  He is taking metformin only and his A1c is down to 6.3.  This is somewhat problematic if it comes to trying to use GLP-1 agonist for diabetes control plus weight loss.  However the diagnosis of diabetes alone may qualify him for a GLP 2 agonist.  We will defer to PCP.
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
## 2022-02-25 NOTE — Assessment & Plan Note (Signed)
Significant morbid obesity but already has 30 pound weight loss.  He wants to try to see how he can do on his own, but with verbal threshold to consider dietary counseling if he does not continue to lose weight.  Hopefully his joint pains will improve so he can start getting exercise which will also help to lose weight.

## 2022-02-25 NOTE — Assessment & Plan Note (Signed)
Is only on metformin, would strongly consider GLP-1 agonist.

## 2022-02-25 NOTE — Assessment & Plan Note (Signed)
Sleep study noted severe OSA 128 apneic episodes per hour with desaturations as low as 66%.  Pending titration of CPAP.  He does not have machine yet. He is looking forward to it.  But otherwise already noted improved sleeping with weight loss.

## 2022-02-25 NOTE — Assessment & Plan Note (Signed)
Referred to cardiology because of concern for possible syncope.  Between myself and neurology both concluded that his symptoms are probably related to OSA and sleepiness.  He did not have any symptoms to suggest syncope or near syncope.  He is already working on weight loss.  No further episodes.  Nothing to suggest an arrhythmia on monitor and structurally normal echo.

## 2022-04-01 ENCOUNTER — Other Ambulatory Visit: Payer: Self-pay | Admitting: Family Medicine

## 2022-04-28 DIAGNOSIS — G4733 Obstructive sleep apnea (adult) (pediatric): Secondary | ICD-10-CM | POA: Diagnosis not present

## 2022-05-29 DIAGNOSIS — G4733 Obstructive sleep apnea (adult) (pediatric): Secondary | ICD-10-CM | POA: Diagnosis not present

## 2022-06-04 DIAGNOSIS — Z713 Dietary counseling and surveillance: Secondary | ICD-10-CM | POA: Diagnosis not present

## 2022-06-27 DIAGNOSIS — G4733 Obstructive sleep apnea (adult) (pediatric): Secondary | ICD-10-CM | POA: Diagnosis not present

## 2022-07-20 ENCOUNTER — Telehealth: Payer: Self-pay

## 2022-07-20 NOTE — Telephone Encounter (Signed)
Copied from CRM [id number]. Topic: General - Other >> Jul 19, 2022  4:57 PM Everette C wrote: Reason for CRM: The patient requested that paperwork related to their license be changed  The patient been limited to driving within a 10 mile radius by their doctor and would like to discuss further when available   Please contact the patient further when possible

## 2022-07-26 ENCOUNTER — Telehealth: Payer: Self-pay | Admitting: Family Medicine

## 2022-07-26 NOTE — Telephone Encounter (Signed)
Patient called, left VM to return the call. There is a duplicate encounter regarding this on 07/20/22, addend and document in that encounter.

## 2022-07-26 NOTE — Telephone Encounter (Signed)
Pt returned call to office. The message from the office says: [facility] Triage Nurse may give patient message. Cb# [phone]

## 2022-07-26 NOTE — Telephone Encounter (Signed)
Patient called, left VM to return the call to the office. If he returns the call, advise him of the provider message.

## 2022-07-28 DIAGNOSIS — G4733 Obstructive sleep apnea (adult) (pediatric): Secondary | ICD-10-CM | POA: Diagnosis not present

## 2022-07-28 NOTE — Telephone Encounter (Signed)
Pt returned call, message  from PCP read,, verbalizes understanding. States he will contact pulmonology.

## 2022-07-29 ENCOUNTER — Telehealth: Payer: Self-pay | Admitting: Adult Health

## 2022-07-29 NOTE — Telephone Encounter (Signed)
PT calling because he came in to see Tammy for DOT clearance. He was not auth to drive more than 10 miles. Since he got his Cpap he has been doing great. He was asking to see Tammy for a CPAP follow up but nothing avail until 4/30.  Is Tammy able to send the letter needed to clear him to drive before that PRE- Follow Up?  Pls call PT to advise @ [phone]

## 2022-07-29 NOTE — Telephone Encounter (Addendum)
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 I informed pt I will send a message to Rubye Oaks, NP to get this letter done. I also informed pt to send [place] the fax number to where he needs this letter to go via MyChart because he stated he [place]'t have it right [place]. I verbalized understanding and will look out for the MyChart message with fax number. Will send message to Tammy for correct verbiage of letter. UPDATE: called pt back and and double booked him with Rubye Oaks, NP tomorrow 07/30/2022 @ 1:30 PM as per TP. Nothing further needed.

## 2022-07-29 NOTE — Telephone Encounter (Signed)
I can see today or tomorrow you can double book my morning or my first afternoon appointment

## 2022-07-30 ENCOUNTER — Encounter: Payer: Self-pay | Admitting: Adult Health

## 2022-07-30 ENCOUNTER — Telehealth: Payer: Self-pay | Admitting: *Deleted

## 2022-07-30 ENCOUNTER — Encounter: Payer: Self-pay | Admitting: *Deleted

## 2022-07-30 ENCOUNTER — Ambulatory Visit (INDEPENDENT_AMBULATORY_CARE_PROVIDER_SITE_OTHER): Payer: BC Managed Care – PPO | Admitting: Adult Health

## 2022-07-30 VITALS — BP 140/70 | HR 74 | Ht 76.0 in | Wt 354.4 lb

## 2022-07-30 DIAGNOSIS — G4733 Obstructive sleep apnea (adult) (pediatric): Secondary | ICD-10-CM | POA: Diagnosis not present

## 2022-07-30 NOTE — Assessment & Plan Note (Signed)
Healthy weight loss discussed 

## 2022-07-30 NOTE — Assessment & Plan Note (Signed)
Very severe sleep apnea-patient is doing exceptionally well on CPAP with significant perceived benefit.  Excellent control compliance.  Continue on current settings.  Plan  Patient Instructions  Continue on CPAP At bedtime  , wear all night long and with naps.  Keep up good work.  Work on healthy weight loss  Healthy sleep regimen  Do not drive if sleepy  Use caution with sedating medications.  Follow up in 4-6 months and As needed .

## 2022-07-30 NOTE — Progress Notes (Signed)
Reviewed and agree with assessment/plan.   Coralyn Helling, MD [facility] Pulmonary/Critical Care 07/30/2022, 2:29 PM Pager:  [phone]

## 2022-07-30 NOTE — Patient Instructions (Signed)
Continue on CPAP At bedtime  , wear all night long and with naps.  Keep up good work.  Work on healthy weight loss  Healthy sleep regimen  Do not drive if sleepy  Use caution with sedating medications.  Follow up in 4-6 months and As needed .

## 2022-07-30 NOTE — Telephone Encounter (Signed)
Letter to [facility] faxed to [phone], received fax that it was sent successfully.
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
## 2022-08-10 ENCOUNTER — Ambulatory Visit: Payer: BC Managed Care – PPO | Admitting: Adult Health

## 2022-09-09 DIAGNOSIS — G4733 Obstructive sleep apnea (adult) (pediatric): Secondary | ICD-10-CM | POA: Diagnosis not present

## 2022-09-10 DIAGNOSIS — Z6841 Body Mass Index (BMI) 40.0 and over, adult: Secondary | ICD-10-CM | POA: Diagnosis not present

## 2022-09-10 DIAGNOSIS — Z713 Dietary counseling and surveillance: Secondary | ICD-10-CM | POA: Diagnosis not present

## 2022-09-21 ENCOUNTER — Encounter: Payer: Self-pay | Admitting: Family Medicine

## 2022-09-21 ENCOUNTER — Ambulatory Visit: Payer: BC Managed Care – PPO | Admitting: Family Medicine

## 2022-09-21 VITALS — BP 125/73 | HR 78 | Ht 75.0 in | Wt 343.0 lb

## 2022-09-21 DIAGNOSIS — G4733 Obstructive sleep apnea (adult) (pediatric): Secondary | ICD-10-CM | POA: Diagnosis not present

## 2022-09-21 NOTE — Assessment & Plan Note (Signed)
Chronic, improved Continue to recommend balanced, lower carb meals. Smaller meal size, adding snacks. Choosing water as drink of choice and increasing purposeful exercise. Body mass index is 42.87 kg/m.

## 2022-09-21 NOTE — Assessment & Plan Note (Signed)
Review of CPAP compliance data now noting 126/126 nights of coverage with CPAP; events down to 3 episodes/hour, at ~6.5 hours/night. Down 30# lbs; bp stable.  Note provided.  F/B Pulm.  Previously seen by cardiology, pulmonary teams, neurology etc

## 2022-09-21 NOTE — Progress Notes (Signed)
Established patient visit   Patient: Steve Beck   DOB: [birth year]   52 y.o. Male  MRN: [id number] Visit Date: 09/21/2022  Today's healthcare provider: Jacky Kindle, FNP  Re Introduced to nurse practitioner role and practice setting.  All questions answered.  Discussed provider/patient relationship and expectations.  Chief Complaint  Patient presents with   Follow-up    DOT form stated 10 miles restriction and have Hearing on Friday [facility]. Pt    Subjective    HPI HPI     Follow-up    Additional comments: DOT form stated 10 miles restriction and have Hearing on Friday [facility]. Pt       Last edited by Shelly Bombard, CMA on 09/21/2022  1:33 PM.      Medications: Outpatient Medications Prior to Visit  Medication Sig   cetirizine (ZYRTEC) 10 MG tablet Take 10 mg by mouth daily.   cholecalciferol (VITAMIN D3) 25 MCG (1000 UNIT) tablet Take 1,000 Units by mouth daily.   Cyanocobalamin (VITAMIN B-12 PO) Take 1 tablet by mouth daily.   esomeprazole (NEXIUM) 40 MG capsule Take 40 mg by mouth daily at 12 noon.   metFORMIN (GLUCOPHAGE-XR) 750 MG 24 hr tablet Take 1 tablet (750 mg total) by mouth 2 (two) times daily with breakfast and lunch.   olmesartan-hydrochlorothiazide (BENICAR HCT) 40-25 MG tablet Take 1 tablet by mouth daily.   rosuvastatin (CRESTOR) 5 MG tablet Take 1 tablet (5 mg total) by mouth daily.   No facility-administered medications prior to visit.    Review of Systems    Objective    BP 125/73 (BP Location: Left Arm, Patient Position: Sitting, Cuff Size: Large)   Pulse 78   Ht 6\' 3"  (1.905 m)   Wt (!) 343 lb (155.6 kg)   SpO2 98%   BMI 42.87 kg/m  BP Readings from Last 3 Encounters:  09/21/22 125/73  07/30/22 (!) 140/70  02/25/22 (!) 150/100   Wt Readings from Last 3 Encounters:  09/21/22 (!) 343 lb (155.6 kg)  07/30/22 (!) 354 lb 6.4 oz (160.8 kg)  02/25/22 (!) 346 lb (156.9 kg)      Physical Exam Vitals and nursing note  reviewed.  Constitutional:      Appearance: Normal appearance. He is obese.  HENT:     Head: Normocephalic and atraumatic.  Cardiovascular:     Rate and Rhythm: Normal rate and regular rhythm.     Pulses: Normal pulses.     Heart sounds: Normal heart sounds.  Pulmonary:     Effort: Pulmonary effort is normal.     Breath sounds: Normal breath sounds.  Musculoskeletal:        General: Normal range of motion.     Cervical back: Normal range of motion.  Skin:    General: Skin is warm and dry.     Capillary Refill: Capillary refill takes less than 2 seconds.  Neurological:     General: No focal deficit present.     Mental Status: He is alert and oriented to person, place, and time. Mental status is at baseline.  Psychiatric:        Mood and Affect: Mood normal.        Behavior: Behavior normal.        Thought Content: Thought content normal.        Judgment: Judgment normal.     No results found for any visits on 09/21/22.  Assessment & Plan  Problem List Items Addressed This Visit       Respiratory   Obstructive sleep apnea syndrome, severe - Primary (Chronic)    Review of CPAP compliance data now noting 126/126 nights of coverage with CPAP; events down to 3 episodes/hour, at ~6.5 hours/night. Down 30# lbs; bp stable.  Note provided.  F/B Pulm.  Previously seen by cardiology, pulmonary teams, neurology etc        Other   Morbid obesity (HCC) (Chronic)    Chronic, improved Continue to recommend balanced, lower carb meals. Smaller meal size, adding snacks. Choosing water as drink of choice and increasing purposeful exercise. Body mass index is 42.87 kg/m.       Return for annual examination.     Leilani Merl, FNP, have reviewed all documentation for this visit. The documentation on 09/21/22 for the exam, diagnosis, procedures, and orders are all accurate and complete.  Jacky Kindle, FNP  [facility] [phone]  (phone) [phone] (fax)  [facility]

## 2022-09-22 ENCOUNTER — Telehealth: Payer: Self-pay

## 2022-09-22 ENCOUNTER — Encounter: Payer: Self-pay | Admitting: Family Medicine

## 2022-09-22 NOTE — Telephone Encounter (Signed)
Copied from CRM [id number]. Topic: General - Inquiry >> Sep 22, 2022  9:55 AM Marlow Baars wrote: Reason for CRM: The patient called in stating DOT needs a letter specifically stating the 10 mile radius has been lifted and the original letter did not say so. Please assist patient further by faxing to [phone]

## 2022-09-22 NOTE — Telephone Encounter (Signed)
Faxed to number provided

## 2022-09-22 NOTE — Telephone Encounter (Signed)
Patient advised.

## 2022-09-23 NOTE — Telephone Encounter (Signed)
Copied from CRM [id number]. Topic: General - Other >> Sep 23, 2022  4:37 PM Epimenio Foot F wrote: Reason for CRM: Pt is calling in requesting that the nurse resends the paperwork she sent yesterday over again and put the attention of officer Apolinar Junes.  Re-faxed letter to officer Apolinar Junes.

## 2022-10-10 DIAGNOSIS — G4733 Obstructive sleep apnea (adult) (pediatric): Secondary | ICD-10-CM | POA: Diagnosis not present

## 2022-10-21 DIAGNOSIS — G4733 Obstructive sleep apnea (adult) (pediatric): Secondary | ICD-10-CM | POA: Diagnosis not present

## 2022-11-09 DIAGNOSIS — G4733 Obstructive sleep apnea (adult) (pediatric): Secondary | ICD-10-CM | POA: Diagnosis not present

## 2022-11-14 ENCOUNTER — Other Ambulatory Visit: Payer: Self-pay | Admitting: Family Medicine

## 2022-11-14 DIAGNOSIS — E1169 Type 2 diabetes mellitus with other specified complication: Secondary | ICD-10-CM

## 2022-11-14 DIAGNOSIS — E1159 Type 2 diabetes mellitus with other circulatory complications: Secondary | ICD-10-CM
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
## 2022-11-17 ENCOUNTER — Other Ambulatory Visit: Payer: Self-pay | Admitting: Family Medicine

## 2022-11-17 DIAGNOSIS — E1169 Type 2 diabetes mellitus with other specified complication: Secondary | ICD-10-CM

## 2022-11-17 DIAGNOSIS — I152 Hypertension secondary to endocrine disorders: Secondary | ICD-10-CM

## 2023-01-20 DIAGNOSIS — G4733 Obstructive sleep apnea (adult) (pediatric): Secondary | ICD-10-CM | POA: Diagnosis not present

## 2023-02-03 ENCOUNTER — Ambulatory Visit: Payer: BC Managed Care – PPO | Admitting: Adult Health

## 2023-02-07 ENCOUNTER — Ambulatory Visit (INDEPENDENT_AMBULATORY_CARE_PROVIDER_SITE_OTHER): Payer: BC Managed Care – PPO | Admitting: Adult Health

## 2023-02-07 ENCOUNTER — Encounter: Payer: Self-pay | Admitting: Adult Health

## 2023-02-07 VITALS — BP 120/70 | HR 65 | Ht 75.0 in | Wt 344.4 lb

## 2023-02-07 DIAGNOSIS — Z23 Encounter for immunization: Secondary | ICD-10-CM

## 2023-02-07 DIAGNOSIS — G4733 Obstructive sleep apnea (adult) (pediatric): Secondary | ICD-10-CM | POA: Diagnosis not present

## 2023-02-07 NOTE — Patient Instructions (Addendum)
Continue on CPAP At bedtime  , wear all night long and with naps.  Keep up good work.  Work on healthy weight loss  Healthy sleep regimen  Do not drive if sleepy  Use caution with sedating medications.  Flu shot today  Follow up in 1 year and As needed

## 2023-02-07 NOTE — Assessment & Plan Note (Signed)
Healthy weight loss discussed 

## 2023-02-07 NOTE — Addendum Note (Signed)
Addended by: Delrae Rend on: 02/07/2023 04:32 PM   Modules accepted: Orders
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
## 2023-02-07 NOTE — Assessment & Plan Note (Signed)
Excellent compliance and control on CPAP.  Continue to wear nightly.  Patient education given.  Plan  Patient Instructions  Continue on CPAP At bedtime  , wear all night long and with naps.  Keep up good work.  Work on healthy weight loss  Healthy sleep regimen  Do not drive if sleepy  Use caution with sedating medications.  Flu shot today  Follow up in 1 year and As needed

## 2023-02-13 ENCOUNTER — Other Ambulatory Visit: Payer: Self-pay | Admitting: Family Medicine

## 2023-02-13 DIAGNOSIS — E1169 Type 2 diabetes mellitus with other specified complication: Secondary | ICD-10-CM

## 2023-02-13 DIAGNOSIS — I152 Hypertension secondary to endocrine disorders: Secondary | ICD-10-CM

## 2023-02-17 ENCOUNTER — Encounter: Payer: BC Managed Care – PPO | Admitting: Family Medicine

## 2023-02-20 DIAGNOSIS — G4733 Obstructive sleep apnea (adult) (pediatric): Secondary | ICD-10-CM | POA: Diagnosis not present

## 2023-02-24 ENCOUNTER — Encounter: Payer: Self-pay | Admitting: Family Medicine

## 2023-02-24 ENCOUNTER — Ambulatory Visit (INDEPENDENT_AMBULATORY_CARE_PROVIDER_SITE_OTHER): Payer: BC Managed Care – PPO | Admitting: Family Medicine

## 2023-02-24 VITALS — BP 129/79 | HR 74 | Ht 75.0 in | Wt 348.0 lb

## 2023-02-24 DIAGNOSIS — N401 Enlarged prostate with lower urinary tract symptoms: Secondary | ICD-10-CM | POA: Diagnosis not present

## 2023-02-24 DIAGNOSIS — Z0000 Encounter for general adult medical examination without abnormal findings: Secondary | ICD-10-CM

## 2023-02-24 DIAGNOSIS — E1159 Type 2 diabetes mellitus with other circulatory complications: Secondary | ICD-10-CM

## 2023-02-24 DIAGNOSIS — E1149 Type 2 diabetes mellitus with other diabetic neurological complication: Secondary | ICD-10-CM | POA: Diagnosis not present

## 2023-02-24 DIAGNOSIS — I152 Hypertension secondary to endocrine disorders: Secondary | ICD-10-CM

## 2023-02-24 DIAGNOSIS — E785 Hyperlipidemia, unspecified: Secondary | ICD-10-CM

## 2023-02-24 DIAGNOSIS — G4733 Obstructive sleep apnea (adult) (pediatric): Secondary | ICD-10-CM

## 2023-02-24 DIAGNOSIS — N3943 Post-void dribbling: Secondary | ICD-10-CM

## 2023-02-24 DIAGNOSIS — Z0001 Encounter for general adult medical examination with abnormal findings: Secondary | ICD-10-CM | POA: Diagnosis not present

## 2023-02-24 DIAGNOSIS — E1169 Type 2 diabetes mellitus with other specified complication: Secondary | ICD-10-CM

## 2023-02-24 MED ORDER — TAMSULOSIN HCL 0.4 MG PO CAPS: 0.4000 mg | ORAL_CAPSULE | Freq: Every day | ORAL | 3 refills | Status: DC

## 2023-02-24 NOTE — Patient Instructions (Signed)
The CDC recommends two doses of Shingrix (the new shingles vaccine) separated by 2 to 6 months for adults age 54 years and older. I recommend checking with your insurance plan regarding coverage for this vaccine.    

## 2023-02-24 NOTE — Progress Notes (Signed)
Complete physical exam   Patient: Steve Beck   DOB: 1971-02-11   52 y.o. Male  MRN: [id number] Visit Date: 02/24/2023  Today's healthcare provider: Jacky Kindle, FNP  Re Introduced to nurse practitioner role and practice setting.  All questions answered.  Discussed provider/patient relationship and expectations.  Chief Complaint  Patient presents with   Annual Exam   Subjective    Steve Beck is a 52 y.o. male who presents today for a complete physical exam.  He reports consuming a general diet. The patient does not participate in regular exercise at present. He generally feels fairly well. He reports sleeping fairly well. He does have additional problems to discuss today.   HPI   The patient presents for an annual physical. He reports no changes in his health since his last visit. He has been managing his diabetes and sleep apnea well. He has been using a sleep apnea machine, which he reports as life-changing. He has a follow-up appointment with a pulmonologist. He has been experiencing some reflux, but it is not bothersome. He has been receiving reminders for a diabetic eye exam, which he plans to schedule. He has noticed some changes with his prostate, including occasional pain and inconsistent urine flow. He has also been experiencing some arthritis in his knee, which he has been managing with Tylenol Arthritis. He has not noticed any swelling in his legs. He has not had his shingles vaccine, but he has had his flu shot.   Past Medical History:  Diagnosis Date   Crohn's disease (HCC)    GERD (gastroesophageal reflux disease)    Hypertension    IBS (irritable bowel syndrome)    Obesity    Past Surgical History:  Procedure Laterality Date   COLONOSCOPY     COLONOSCOPY WITH PROPOFOL N/A 03/17/2015   Procedure: COLONOSCOPY WITH PROPOFOL;  Surgeon: Elnita Maxwell, MD;  Location: [facility] ENDOSCOPY;  Service: Endoscopy;  Laterality: N/A;   LEFT HEART CATHETERIZATION  WITH CORONARY ANGIOGRAM N/A 03/23/2013   Procedure: LEFT HEART CATHETERIZATION WITH CORONARY ANGIOGRAM;  Surgeon: Laurey Morale, MD;  Location: [facility] CATH LAB;  Service: Cardiovascular;: Normal coronary arteries.  Normal LV systolic function.  Normal EDP.   none     TRANSTHORACIC ECHOCARDIOGRAM  02/04/2022   EF~55%.  Normal function, no RWMA.  Mild LVH.  GR 1 DD with moderate LA dilation..  Unable to assess PAP, but normal RAP estimated.  Normal aortic and mitral valves.Luci Bank Patch Monitor-12 days  01/2022   Predominantly SR: Rang 52 over 118 bpm, Avg 74 bpm. Occasional PACs (1.5%)& PVCs (1.1%)-rare PVC couplets, triplets and trigeminy.  3 Atrial Runs (Fastest: 4 beats, HR 158-164 bpm, ~161 bpm, 1.6 seconds. Longest: 16 beats, 92-141 bpm, ~111 bpm -but 1 sec. no sustained arrhythmias, bradycardia or pauses.   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  Tobacco Use   Smoking status: Former    Passive exposure: Past   Smokeless tobacco: Former    Types: Chew    Quit date: 2006   Tobacco comments:    Smoked for 17 years - quit ~2006  Vaping Use   Vaping status: Never  Used  Substance and Sexual Activity   Alcohol use: No   Drug use: No   Sexual activity: Yes  Other Topics Concern   Not on file  Social History Narrative   He is a former Librarian, academic.   Social Determinants of Health   Financial Resource Strain: Low Risk  (02/23/2023)   Overall Financial Resource Strain (CARDIA)    Difficulty of Paying Living Expenses: Not hard at all  Food Insecurity: No Food Insecurity (02/23/2023)   Hunger Vital Sign    Worried About Running Out of Food in the Last Year: Never true    Ran Out of Food in the Last Year: Never true   Transportation Needs: No Transportation Needs (02/23/2023)   PRAPARE - Administrator, Civil Service (Medical): No    Lack of Transportation (Non-Medical): No  Physical Activity: Insufficiently Active (02/23/2023)   Exercise Vital Sign    Days of Exercise per Week: 3 days    Minutes of Exercise per Session: 20 min  Stress: No Stress Concern Present (02/23/2023)   [employer] of Occupational Health - Occupational Stress Questionnaire    Feeling of Stress : Not at all  Social Connections: Socially Integrated (02/23/2023)   Social Connection and Isolation Panel [NHANES]    Frequency of Communication with Friends and Family: Three times a week    Frequency of Social Gatherings with Friends and Family: Never    Attends Religious Services: More than 4 times per year    Active Member of [employer] or Organizations: Yes    Attends Engineer, structural: More than 4 times per year    Marital Status: Married  Catering manager Violence: Not on file   Family Status  Relation Name Status   Father  Deceased   Mother  Alive   Sister  Alive   Brother  Alive   Other  (Not Specified)  No partnership data on file   Family History  Problem Relation Age of Onset   Esophageal cancer Father    Pulmonary embolism Father    Hypertension Mother        H/o heart murmur but no CAD   Migraines Mother    Healthy Sister    Cancer Brother        mouth   Diabetes Mellitus II Other    Allergies  Allergen Reactions   Augmentin [Amoxicillin-Pot Clavulanate]     Patient Care Team: Jacky Kindle, FNP as PCP - General (Family Medicine)   Medications: Outpatient Medications Prior to Visit  Medication Sig   cetirizine (ZYRTEC) 10 MG tablet Take 10 mg by mouth daily.   cholecalciferol (VITAMIN D3) 25 MCG (1000 UNIT) tablet Take 1,000 Units by mouth daily.   Cyanocobalamin (VITAMIN B-12 PO) Take 1 tablet by mouth daily.   esomeprazole (NEXIUM) 40 MG capsule Take 40 mg by mouth  daily at 12 noon.   metFORMIN (GLUCOPHAGE-XR) 750 MG 24 hr tablet Take 1 tablet (750 mg total) by mouth 2 (two) times daily with breakfast and lunch.   olmesartan-hydrochlorothiazide (BENICAR HCT) 40-25 MG tablet TAKE 1 TABLET BY MOUTH DAILY   rosuvastatin (CRESTOR) 5 MG tablet TAKE 1 TABLET BY MOUTH DAILY   No facility-administered medications prior to visit.    Objective    BP 129/79 (BP Location: Right Arm, Patient Position: Standing, Cuff Size: Large)   Pulse 74   Ht 6\' 3"  (1.905 m)   Wt (!) 348 lb (157.9 kg)   SpO2 96%  BMI 43.50 kg/m   Physical Exam Vitals and nursing note reviewed.  Constitutional:      General: He is awake. He is not in acute distress.    Appearance: Normal appearance. He is well-developed and well-groomed. He is obese. He is not ill-appearing, toxic-appearing or diaphoretic.  HENT:     Head: Normocephalic and atraumatic.     Jaw: There is normal jaw occlusion. No trismus, tenderness, swelling or pain on movement.     Salivary Glands: Right salivary gland is not diffusely enlarged or tender. Left salivary gland is not diffusely enlarged or tender.     Right Ear: Hearing, tympanic membrane, ear canal and external ear normal. There is no impacted cerumen.     Left Ear: Hearing, tympanic membrane, ear canal and external ear normal. There is no impacted cerumen.     Nose: Nose normal. No congestion or rhinorrhea.     Right Turbinates: Not enlarged, swollen or pale.     Left Turbinates: Not enlarged, swollen or pale.     Right Sinus: No maxillary sinus tenderness or frontal sinus tenderness.     Left Sinus: No maxillary sinus tenderness or frontal sinus tenderness.     Mouth/Throat:     Lips: Pink.     Mouth: Mucous membranes are moist. No injury, lacerations, oral lesions or angioedema.     Pharynx: Oropharynx is clear. Uvula midline. No pharyngeal swelling, oropharyngeal exudate or posterior oropharyngeal erythema.     Tonsils: No tonsillar exudate or  tonsillar abscesses.  Eyes:     General: Lids are normal. Vision grossly intact. Gaze aligned appropriately.        Right eye: No discharge.        Left eye: No discharge.     Extraocular Movements: Extraocular movements intact.     Conjunctiva/sclera: Conjunctivae normal.     Pupils: Pupils are equal, round, and reactive to light.  Neck:     Thyroid: No thyroid mass, thyromegaly or thyroid tenderness.     Vascular: No carotid bruit.     Trachea: Trachea normal. No tracheal tenderness.  Cardiovascular:     Rate and Rhythm: Normal rate and regular rhythm.     Pulses: Normal pulses.          Carotid pulses are 2+ on the right side and 2+ on the left side.      Radial pulses are 2+ on the right side and 2+ on the left side.       Femoral pulses are 2+ on the right side and 2+ on the left side.      Popliteal pulses are 2+ on the right side and 2+ on the left side.       Dorsalis pedis pulses are 2+ on the right side and 2+ on the left side.       Posterior tibial pulses are 2+ on the right side and 2+ on the left side.     Heart sounds: Normal heart sounds, S1 normal and S2 normal. No murmur heard.    No friction rub. No gallop.  Pulmonary:     Effort: Pulmonary effort is normal. No respiratory distress.     Breath sounds: Normal breath sounds and air entry. No stridor. No wheezing, rhonchi or rales.  Chest:     Chest wall: No tenderness.  Abdominal:     General: Abdomen is flat. Bowel sounds are normal. There is no distension.     Palpations: Abdomen is soft. There is  no mass.     Tenderness: There is no abdominal tenderness. There is no guarding or rebound.     Hernia: No hernia is present.  Genitourinary:    Comments: Exam deferred; complaints of dribbling urine and urge incontinence  Musculoskeletal:        General: No swelling, tenderness, deformity or signs of injury. Normal range of motion.     Cervical back: Normal range of motion and neck supple. No rigidity or tenderness.      Right lower leg: No edema.     Left lower leg: No edema.  Lymphadenopathy:     Cervical: No cervical adenopathy.     Right cervical: No superficial, deep or posterior cervical adenopathy.    Left cervical: No superficial, deep or posterior cervical adenopathy.  Skin:    General: Skin is warm and dry.     Capillary Refill: Capillary refill takes less than 2 seconds.     Coloration: Skin is not jaundiced or pale.     Findings: No bruising, erythema, lesion or rash.  Neurological:     General: No focal deficit present.     Mental Status: He is alert and oriented to person, place, and time. Mental status is at baseline.     GCS: GCS eye subscore is 4. GCS verbal subscore is 5. GCS motor subscore is 6.     Sensory: Sensation is intact. No sensory deficit.     Motor: Motor function is intact. No weakness.     Coordination: Coordination is intact.     Gait: Gait is intact.  Psychiatric:        Attention and Perception: Attention and perception normal.        Mood and Affect: Mood and affect normal.        Speech: Speech normal.        Behavior: Behavior normal. Behavior is cooperative.        Thought Content: Thought content normal.        Cognition and Memory: Cognition normal.        Judgment: Judgment normal.     Last depression screening scores    02/24/2023   10:05 AM 09/21/2022    2:33 PM 02/11/2022   10:31 AM  PHQ 2/9 Scores  PHQ - 2 Score 0 0 0  PHQ- 9 Score 0 0 3   Last fall risk screening    02/11/2022   10:31 AM  Fall Risk   Falls in the past year? 0  Number falls in past yr: 0  Injury with Fall? 0  Risk for fall due to : No Fall Risks  Follow up Falls evaluation completed   Last Audit-C alcohol use screening    02/23/2023    9:39 PM  Alcohol Use Disorder Test (AUDIT)  1. How often do you have a drink containing alcohol? 0  3. How often do you have six or more drinks on one occasion? 0   A score of 3 or more in women, and 4 or more in men indicates  increased risk for alcohol abuse, EXCEPT if all of the points are from question 1   No results found for any visits on 02/24/23.  Assessment & Plan    Routine Health Maintenance and Physical Exam  Exercise Activities and Dietary recommendations  Goals   None     Immunization History  Administered Date(s) Administered   Dtap, Unspecified 11/04/2011   Influenza, Seasonal, Injecte, Preservative Fre 02/07/2023   Influenza,inj,Quad PF,6+  Mos 01/22/2021   Tdap 02/11/2022    Health Maintenance  Topic Date Due   COVID-19 Vaccine (1) Never done   FOOT EXAM  Never done   Diabetic kidney evaluation - Urine ACR  Never done   Zoster Vaccines- Shingrix (1 of 2) Never done   OPHTHALMOLOGY EXAM  04/26/2020   HEMOGLOBIN A1C  08/12/2022   Diabetic kidney evaluation - eGFR measurement  02/12/2023   Colonoscopy  03/16/2025   DTaP/Tdap/Td (3 - Td or Tdap) 02/12/2032   INFLUENZA VACCINE  Completed   Hepatitis C Screening  Completed   HIV Screening  Completed   HPV VACCINES  Aged Out    Discussed health benefits of physical activity, and encouraged him to engage in regular exercise appropriate for his age and condition.  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 ratio     Genitourinary   Benign prostatic hyperplasia with post-void dribbling   Relevant Medications   tamsulosin (FLOMAX) 0.4 MG CAPS capsule     Other   Morbid obesity (HCC) - Primary (Chronic)   Annual physical exam   Relevant Orders   CBC with Differential/Platelet   Comprehensive metabolic panel   Lipid panel   PSA   TSH   Benign Prostatic Hyperplasia Reports of inconsistent urinary stream and  urgency. No pain or hematuria. Discussed the risk of urinary retention and infection. -Start Flomax at bedtime to improve bladder muscle function. -Check PSA today to rule out prostate cancer. -Reach out in a month to assess the effectiveness of Flomax.  Hypertension Blood pressure controlled at 129/79. Patient reports occasional dizziness, which he attributes to his blood pressure medication. -Check electrolytes in lab work today to rule out any electrolyte imbalance due to diuretic portion of medication. -Advise patient to monitor blood pressure at home and report readings.  Obesity Weight increased by 5 pounds since last visit in June. -Advise patient to monitor weight and consider weight management strategies.  Sleep Apnea Patient reports good compliance with CPAP machine. -Continue current management.  Gastroesophageal Reflux Disease No current complaints. -Continue current management with store brand Omeprazole.  General Health Maintenance -Pt declines diabetic foot exam today. -Check cholesterol, thyroid, cell count, and blood chemistry in lab work today. -Check urine for protein. -Advise patient to schedule diabetic eye exam. -Eligible for shingles vaccine, advise patient to consider getting it. -Consider arthritis management strategies for knee pain, including potential orthopedic consultation. Continue weight mgmt. Body mass index is 43.5 kg/m.  Return in about 4 weeks (around 03/24/2023), or if symptoms worsen or fail to improve- flomax.    Leilani Merl, FNP, have reviewed all documentation for this visit. The documentation on 02/24/23 for the exam, diagnosis, procedures, and orders are all accurate and complete.  Jacky Kindle, FNP  [facility] [phone] (phone) [phone] (fax)  [facility]

## 2023-02-25 LAB — CBC WITH DIFFERENTIAL/PLATELET
Basophils Absolute: 0.1 10*3/uL (ref 0.0–0.2)
Basos: 1 %
EOS (ABSOLUTE): 0.1 10*3/uL (ref 0.0–0.4)
Eos: 1 %
Hematocrit: 40.6 % (ref 37.5–51.0)
Hemoglobin: 13 g/dL (ref 13.0–17.7)
Immature Grans (Abs): 0 10*3/uL (ref 0.0–0.1)
Immature Granulocytes: 0 %
Lymphocytes Absolute: 2.1 10*3/uL (ref 0.7–3.1)
Lymphs: 22 %
MCH: 28.3 pg (ref 26.6–33.0)
MCHC: 32 g/dL (ref 31.5–35.7)
MCV: 88 fL (ref 79–97)
Monocytes Absolute: 1 10*3/uL — ABNORMAL HIGH (ref 0.1–0.9)
Monocytes: 11 %
Neutrophils Absolute: 6.4 10*3/uL (ref 1.4–7.0)
Neutrophils: 65 %
Platelets: 316 10*3/uL (ref 150–450)
RBC: 4.6 x10E6/uL (ref 4.14–5.80)
RDW: 13.6 % (ref 11.6–15.4)
WBC: 9.7 10*3/uL (ref 3.4–10.8)

## 2023-02-25 LAB — COMPREHENSIVE METABOLIC PANEL
ALT: 26 [IU]/L (ref 0–44)
AST: 20 [IU]/L (ref 0–40)
Albumin: 4.6 g/dL (ref 3.8–4.9)
Alkaline Phosphatase: 72 [IU]/L (ref 44–121)
BUN/Creatinine Ratio: 19 (ref 9–20)
BUN: 13 mg/dL (ref 6–24)
Bilirubin Total: 0.3 mg/dL (ref 0.0–1.2)
CO2: 21 mmol/L (ref 20–29)
Calcium: 10.2 mg/dL (ref 8.7–10.2)
Chloride: 101 mmol/L (ref 96–106)
Creatinine, Ser: 0.68 mg/dL — ABNORMAL LOW (ref 0.76–1.27)
Globulin, Total: 2.3 g/dL (ref 1.5–4.5)
Glucose: 90 mg/dL (ref 70–99)
Potassium: 4.5 mmol/L (ref 3.5–5.2)
Sodium: 143 mmol/L (ref 134–144)
Total Protein: 6.9 g/dL (ref 6.0–8.5)
eGFR: 110 mL/min/{1.73_m2} (ref >59)

## 2023-02-25 LAB — PSA: Prostate Specific Ag, Serum: 0.3 ng/mL (ref 0.0–4.0)

## 2023-02-25 LAB — LIPID PANEL
Chol/HDL Ratio: 3 ratio (ref 0.0–5.0)
Cholesterol, Total: 100 mg/dL (ref 100–199)
HDL: 33 mg/dL — ABNORMAL LOW (ref >39)
LDL Chol Calc (NIH): 41 mg/dL (ref 0–99)
Triglycerides: 154 mg/dL — ABNORMAL HIGH (ref 0–149)
VLDL Cholesterol Cal: 26 mg/dL (ref 5–40)

## 2023-02-25 LAB — HEMOGLOBIN A1C
Est. average glucose Bld gHb Est-mCnc: 146 mg/dL
Hgb A1c MFr Bld: 6.7 % — ABNORMAL HIGH (ref 4.8–5.6)

## 2023-02-25 LAB — MICROALBUMIN / CREATININE URINE RATIO
Creatinine, Urine: 72.6 mg/dL
Microalb/Creat Ratio: 20 mg/g{creat} (ref 0–29)
Microalbumin, Urine: 14.5 ug/mL

## 2023-02-25 LAB — TSH: TSH: 1.48 u[IU]/mL (ref 0.450–4.500)

## 2023-02-27 NOTE — Progress Notes (Signed)
Labs indicated A1c is back in diabetic range; recommend 6 month f/u appt. Continue to recommend balanced, lower carb meals. Smaller meal size, adding snacks. Choosing water as drink of choice and increasing purposeful exercise.

## 2023-03-22 DIAGNOSIS — G4733 Obstructive sleep apnea (adult) (pediatric): Secondary | ICD-10-CM | POA: Diagnosis not present

## 2023-05-16 ENCOUNTER — Telehealth: Payer: Self-pay | Admitting: Family Medicine

## 2023-05-16 DIAGNOSIS — E1159 Type 2 diabetes mellitus with other circulatory complications: Secondary | ICD-10-CM

## 2023-05-16 NOTE — Telephone Encounter (Signed)
Received fax from [facility] asking for refills on Olmesartan - hydrochlorothiazide 40-25 mg. # 90

## 2023-05-17 MED ORDER — OLMESARTAN MEDOXOMIL-HCTZ 40-25 MG PO TABS: 1.0000 | ORAL_TABLET | Freq: Every day | ORAL | 0 refills | Status: DC

## 2023-05-17 NOTE — Addendum Note (Signed)
Addended by: Marjie Skiff on: 05/17/2023 05:08 PM   Modules accepted: Orders

## 2023-05-20 ENCOUNTER — Other Ambulatory Visit: Payer: Self-pay

## 2023-05-20 ENCOUNTER — Telehealth: Payer: Self-pay | Admitting: Family Medicine

## 2023-05-20 DIAGNOSIS — E1169 Type 2 diabetes mellitus with other specified complication: Secondary | ICD-10-CM

## 2023-05-20 NOTE — Telephone Encounter (Signed)
[facility] faxed refill request for the following medications:  rosuvastatin (CRESTOR) 5 MG tablet   Please advise.

## 2023-05-23 MED ORDER — ROSUVASTATIN CALCIUM 5 MG PO TABS: 5.0000 mg | ORAL_TABLET | Freq: Every day | ORAL | 0 refills | Status: DC

## 2023-08-10 ENCOUNTER — Telehealth: Payer: Self-pay | Admitting: Family Medicine

## 2023-08-10 MED ORDER — TAMSULOSIN HCL 0.4 MG PO CAPS: 0.4000 mg | ORAL_CAPSULE | Freq: Every day | ORAL | 3 refills | Status: DC

## 2023-08-10 NOTE — Telephone Encounter (Signed)
 [facility] is requesting refill tamsulosin  (FLOMAX ) 0.4 MG CAPS capsule   Please advise

## 2023-08-18 ENCOUNTER — Other Ambulatory Visit: Payer: Self-pay | Admitting: Family Medicine

## 2023-08-18 DIAGNOSIS — E1169 Type 2 diabetes mellitus with other specified complication: Secondary | ICD-10-CM

## 2023-08-18 DIAGNOSIS — E1159 Type 2 diabetes mellitus with other circulatory complications: Secondary | ICD-10-CM

## 2023-09-14 ENCOUNTER — Other Ambulatory Visit: Payer: Self-pay | Admitting: Family Medicine

## 2023-09-14 ENCOUNTER — Telehealth: Payer: Self-pay | Admitting: Family Medicine

## 2023-09-14 ENCOUNTER — Other Ambulatory Visit: Payer: Self-pay

## 2023-09-14 DIAGNOSIS — E1159 Type 2 diabetes mellitus with other circulatory complications: Secondary | ICD-10-CM

## 2023-09-14 DIAGNOSIS — E1169 Type 2 diabetes mellitus with other specified complication: Secondary | ICD-10-CM

## 2023-09-14 NOTE — Telephone Encounter (Signed)
 Converted to RF req

## 2023-09-14 NOTE — Telephone Encounter (Signed)
 [facility] faxed refill request for the following medications:    olmesartan -hydrochlorothiazide (BENICAR  HCT) 40-25 MG tablet  Please advise

## 2023-09-19 ENCOUNTER — Telehealth: Payer: Self-pay | Admitting: Family Medicine

## 2023-09-19 ENCOUNTER — Other Ambulatory Visit: Payer: Self-pay

## 2023-09-19 DIAGNOSIS — I152 Hypertension secondary to endocrine disorders: Secondary | ICD-10-CM

## 2023-09-19 NOTE — Telephone Encounter (Signed)
Converted to refill request 

## 2023-09-19 NOTE — Telephone Encounter (Signed)
 [facility] is requesting refill olmesartan -hydrochlorothiazide (BENICAR  HCT) 40-25 MG tablet   Please advise

## 2023-09-22 ENCOUNTER — Telehealth: Payer: Self-pay | Admitting: Family Medicine

## 2023-09-22 ENCOUNTER — Other Ambulatory Visit: Payer: Self-pay

## 2023-09-22 DIAGNOSIS — I152 Hypertension secondary to endocrine disorders: Secondary | ICD-10-CM

## 2023-09-22 MED ORDER — OLMESARTAN MEDOXOMIL-HCTZ 40-25 MG PO TABS: 1.0000 | ORAL_TABLET | Freq: Every day | ORAL | 0 refills | Status: DC

## 2023-09-22 NOTE — Telephone Encounter (Signed)
 [facility] is requesting refill olmesartan -hydrochlorothiazide (BENICAR  HCT) 40-25 MG tablet   Please advise

## 2023-09-25 DIAGNOSIS — Z87442 Personal history of urinary calculi: Secondary | ICD-10-CM | POA: Diagnosis not present

## 2023-09-25 DIAGNOSIS — M545 Low back pain, unspecified: Secondary | ICD-10-CM | POA: Diagnosis not present

## 2023-09-25 DIAGNOSIS — N3001 Acute cystitis with hematuria: Secondary | ICD-10-CM | POA: Diagnosis not present

## 2023-10-12 ENCOUNTER — Other Ambulatory Visit: Payer: Self-pay | Admitting: Family Medicine

## 2023-10-12 DIAGNOSIS — E1169 Type 2 diabetes mellitus with other specified complication: Secondary | ICD-10-CM

## 2023-10-21 ENCOUNTER — Other Ambulatory Visit: Payer: Self-pay | Admitting: Family Medicine

## 2023-10-21 DIAGNOSIS — I152 Hypertension secondary to endocrine disorders: Secondary | ICD-10-CM

## 2023-11-10 ENCOUNTER — Other Ambulatory Visit: Payer: Self-pay | Admitting: Family Medicine

## 2023-11-10 DIAGNOSIS — E1159 Type 2 diabetes mellitus with other circulatory complications: Secondary | ICD-10-CM

## 2023-11-22 ENCOUNTER — Ambulatory Visit (INDEPENDENT_AMBULATORY_CARE_PROVIDER_SITE_OTHER): Admitting: Nurse Practitioner

## 2023-11-22 ENCOUNTER — Encounter: Payer: Self-pay | Admitting: Nurse Practitioner

## 2023-11-22 VITALS — BP 118/74 | HR 67 | Temp 97.1°F | Ht 75.0 in | Wt 337.4 lb

## 2023-11-22 DIAGNOSIS — G4733 Obstructive sleep apnea (adult) (pediatric): Secondary | ICD-10-CM

## 2023-11-22 NOTE — Assessment & Plan Note (Signed)
 Very severe OSA on CPAP.  Excellent compliance and control.  Receives benefit from use.  Aware of proper care/use of device.  Understands risks of untreated severe sleep apnea.  Encouraged to continue using nightly.  DMV form completed and provided patient with 1 year of download data.  Safe driving practices reviewed.  Understands to not drive or pull over if he becomes drowsy.  Healthy weight loss encouraged.  Patient Instructions  Continue to use CPAP every night, minimum of 4-6 hours a night.  Change equipment as directed. Wash your tubing with warm soap and water daily, hang to dry. Wash humidifier portion weekly. Use bottled, distilled water and change daily Be aware of reduced alertness and do not drive or operate heavy machinery if experiencing this or drowsiness.  Exercise encouraged, as tolerated. Healthy weight management discussed.  Avoid or decrease alcohol consumption and medications that make you more sleepy, if possible. Notify if persistent daytime sleepiness occurs even with consistent use of PAP therapy.  Change CPAP supplies... Every month Mask cushions and/or nasal pillows CPAP machine filters Every 3 months Mask frame (not including the headgear) CPAP tubing Every 6 months Mask headgear Chin strap (if applicable) Humidifier water tub  Follow up in one year with Katie Marenda Accardi,NP, or sooner, if needed
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
## 2023-11-22 NOTE — Patient Instructions (Signed)
 Continue to use CPAP every night, minimum of 4-6 hours a night.  Change equipment as directed. Wash your tubing with warm soap and water daily, hang to dry. Wash humidifier portion weekly. Use bottled, distilled water and change daily Be aware of reduced alertness and do not drive or operate heavy machinery if experiencing this or drowsiness.  Exercise encouraged, as tolerated. Healthy weight management discussed.  Avoid or decrease alcohol consumption and medications that make you more sleepy, if possible. Notify if persistent daytime sleepiness occurs even with consistent use of PAP therapy.  Change CPAP supplies... Every month Mask cushions and/or nasal pillows CPAP machine filters Every 3 months Mask frame (not including the headgear) CPAP tubing Every 6 months Mask headgear Chin strap (if applicable) Humidifier water tub  Follow up in one year with Katie Tishia Maestre,NP, or sooner, if needed

## 2023-12-07 ENCOUNTER — Other Ambulatory Visit: Payer: Self-pay | Admitting: Family Medicine

## 2023-12-07 DIAGNOSIS — E1159 Type 2 diabetes mellitus with other circulatory complications: Secondary | ICD-10-CM

## 2023-12-08 DIAGNOSIS — H524 Presbyopia: Secondary | ICD-10-CM | POA: Diagnosis not present

## 2023-12-15 LAB — LIPID PANEL
Cholesterol: 115 (ref 0–200)
HDL: 36 (ref 35–70)
LDL Cholesterol: 43
Triglycerides: 178 — AB (ref 40–160)

## 2023-12-15 LAB — BASIC METABOLIC PANEL WITH GFR: Glucose: 111

## 2023-12-15 LAB — HEMOGLOBIN A1C: Hemoglobin A1C: 5.4

## 2023-12-19 ENCOUNTER — Encounter: Payer: Self-pay | Admitting: Family Medicine

## 2023-12-19 ENCOUNTER — Ambulatory Visit: Admitting: Family Medicine

## 2023-12-19 VITALS — BP 138/82 | HR 70 | Temp 97.4°F | Ht 75.0 in | Wt 340.7 lb

## 2023-12-19 DIAGNOSIS — N400 Enlarged prostate without lower urinary tract symptoms: Secondary | ICD-10-CM

## 2023-12-19 DIAGNOSIS — E1169 Type 2 diabetes mellitus with other specified complication: Secondary | ICD-10-CM

## 2023-12-19 DIAGNOSIS — E1159 Type 2 diabetes mellitus with other circulatory complications: Secondary | ICD-10-CM

## 2023-12-19 DIAGNOSIS — R051 Acute cough: Secondary | ICD-10-CM

## 2023-12-19 DIAGNOSIS — Z23 Encounter for immunization: Secondary | ICD-10-CM | POA: Diagnosis not present

## 2023-12-19 DIAGNOSIS — J069 Acute upper respiratory infection, unspecified: Secondary | ICD-10-CM

## 2023-12-19 DIAGNOSIS — I152 Hypertension secondary to endocrine disorders: Secondary | ICD-10-CM

## 2023-12-19 DIAGNOSIS — E785 Hyperlipidemia, unspecified: Secondary | ICD-10-CM

## 2023-12-19 DIAGNOSIS — Z87828 Personal history of other (healed) physical injury and trauma: Secondary | ICD-10-CM

## 2023-12-19 MED ORDER — ROSUVASTATIN CALCIUM 5 MG PO TABS: 5.0000 mg | ORAL_TABLET | Freq: Every day | ORAL | 1 refills | Status: AC

## 2023-12-19 MED ORDER — DOXYCYCLINE HYCLATE 100 MG PO TABS: 100.0000 mg | ORAL_TABLET | Freq: Two times a day (BID) | ORAL | 0 refills | Status: DC

## 2023-12-19 MED ORDER — TAMSULOSIN HCL 0.4 MG PO CAPS: 0.4000 mg | ORAL_CAPSULE | Freq: Every day | ORAL | 1 refills | Status: AC

## 2023-12-19 MED ORDER — OLMESARTAN MEDOXOMIL-HCTZ 40-25 MG PO TABS: 1.0000 | ORAL_TABLET | Freq: Every day | ORAL | 1 refills | Status: AC
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
## 2024-03-01 ENCOUNTER — Ambulatory Visit (INDEPENDENT_AMBULATORY_CARE_PROVIDER_SITE_OTHER)

## 2024-03-01 VITALS — BP 126/70 | HR 58 | Ht 75.0 in | Wt 336.3 lb

## 2024-03-01 DIAGNOSIS — M62838 Other muscle spasm: Secondary | ICD-10-CM

## 2024-03-01 DIAGNOSIS — Z0001 Encounter for general adult medical examination with abnormal findings: Secondary | ICD-10-CM | POA: Diagnosis not present

## 2024-03-01 DIAGNOSIS — Z0000 Encounter for general adult medical examination without abnormal findings: Secondary | ICD-10-CM

## 2024-03-01 DIAGNOSIS — Z7984 Long term (current) use of oral hypoglycemic drugs: Secondary | ICD-10-CM

## 2024-03-01 DIAGNOSIS — E1149 Type 2 diabetes mellitus with other diabetic neurological complication: Secondary | ICD-10-CM

## 2024-03-01 MED ORDER — CYCLOBENZAPRINE HCL 5 MG PO TABS: 5.0000 mg | ORAL_TABLET | Freq: Every evening | ORAL | 3 refills | Status: AC | PRN

## 2024-03-01 NOTE — Progress Notes (Signed)
 Complete physical exam   Patient: Steve Beck   DOB: 09/09/1970   53 y.o. Male  MRN: [id number] Visit Date: 03/01/2024  Today's healthcare provider: Isaiah DELENA Pepper, MD   Chief Complaint  Patient presents with   Annual Exam   Subjective    Steve Beck is a 53 y.o. male who presents today for a complete physical exam.  He reports consuming a general diet. The patient has a physically strenuous job, but has no regular exercise apart from work.  He generally feels well. He reports sleeping well. He does have additional problems to discuss today.    Discussed the use of AI scribe software for clinical note transcription with the patient, who gave verbal consent to proceed.  History of Present Illness Steve Beck is a 53 year old male who presents for an annual physical exam.  He experiences intermittent pain above the kidney area, particularly after long work shifts, such as a 13-hour shift. The pain is described as grabbing and is located on the side, around the upper back area. He previously was prescribed a muscle relaxer, which helped alleviate the pain. He last took the muscle relaxer about six months ago. Tylenol  and ibuprofen only ease the pain at night, and the pain is most pronounced when trying to sleep.  He is currently taking Benicar  for blood pressure. He also takes Crestor  for cholesterol, Nexium for acid reflux, and Flomax . Nexium is taken every morning, and he uses vitamin D and B12 supplements. He is no longer taking antibiotics.  He has a history of diabetes but reports that his A1c has improved, and he no longer considers himself diabetic. He stopped taking metformin  due to stomach issues. He experiences occasional tingling in his toes, which worsens with high sugar intake.  He uses a CPAP machine at night, which has been effective. He has a history of Crohn's disease diagnosed about ten years ago but reports no current issues and is not seeing a  gastroenterologist.  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 He has lost weight, dropping from 370 to 325 pounds, by watching his diet and maintaining physical activity at work.   Last depression screening scores    03/01/2024    8:35 AM 02/24/2023   10:05 AM 09/21/2022    2:33 PM  PHQ 2/9 Scores  PHQ - 2 Score 0 0 0  PHQ- 9 Score  0  0      Data saved with a previous flowsheet row definition   Last fall risk screening    03/01/2024    8:35 AM  Fall Risk   Falls in the past year? 0  Number falls in past yr: 0  Injury with Fall? 0  Risk for fall due to : No Fall Risks  Follow up Falls evaluation completed    Vision:Within last year Dentist: Within last year    Medications: Outpatient Medications Prior to Visit  Medication Sig   cetirizine (ZYRTEC) 10 MG tablet Take 10 mg by mouth daily.   cholecalciferol (VITAMIN D3) 25 MCG (1000 UNIT) tablet Take 1,000 Units by mouth daily.   Cyanocobalamin (VITAMIN B-12 PO) Take 1 tablet by mouth daily.   esomeprazole (NEXIUM) 40 MG capsule Take 40 mg by mouth daily at 12 noon.   olmesartan -hydrochlorothiazide (BENICAR  HCT) 40-25 MG tablet Take 1 tablet by mouth daily.   rosuvastatin  (CRESTOR ) 5 MG tablet Take 1 tablet (5 mg  total) by mouth daily.   tamsulosin  (FLOMAX ) 0.4 MG CAPS capsule Take 1 capsule (0.4 mg total) by mouth daily.   [DISCONTINUED] doxycycline  (VIBRA -TABS) 100 MG tablet Take 1 tablet (100 mg total) by mouth 2 (two) times daily.   No facility-administered medications prior to visit.   Crohns disease  Review of Systems as noted in HPI.     Objective    BP 126/70 (BP Location: Left Arm, Patient Position: Sitting, Cuff Size: Normal)   Pulse (!) 58   Ht 6' 3 (1.905 m)   Wt (!) 336 lb 4.8 oz (152.5 kg)   SpO2 100%   BMI 42.03 kg/m    Physical Exam Constitutional:      Appearance: Normal appearance.  HENT:     Head: Normocephalic and  atraumatic.     Mouth/Throat:     Mouth: Mucous membranes are moist.  Eyes:     Pupils: Pupils are equal, round, and reactive to light.  Cardiovascular:     Rate and Rhythm: Normal rate and regular rhythm.     Heart sounds: Normal heart sounds.  Pulmonary:     Effort: Pulmonary effort is normal.     Breath sounds: Normal breath sounds.  Musculoskeletal:     Comments: Right lumbar muscle spasm present  Skin:    General: Skin is warm.  Neurological:     General: No focal deficit present.     Mental Status: He is alert.      No results found for any visits on 03/01/24.  Assessment & Plan    Routine Health Maintenance and Physical Exam  Immunization History  Administered Date(s) Administered   Dtap, Unspecified 11/04/2011   Influenza, Seasonal, Injecte, Preservative Fre 02/07/2023, 12/19/2023   Influenza,inj,Quad PF,6+ Mos 01/22/2021   Tdap 02/11/2022    Health Maintenance  Topic Date Due   FOOT EXAM  Never done   Pneumococcal Vaccine: 50+ Years (1 of 2 - PCV) Never done   Hepatitis B Vaccines 19-59 Average Risk (1 of 3 - 19+ 3-dose series) Never done   Zoster Vaccines- Shingrix (1 of 2) Never done   OPHTHALMOLOGY EXAM  04/26/2020   COVID-19 Vaccine (1 - 2025-26 season) Never done   Diabetic kidney evaluation - Urine ACR  02/24/2024   HEMOGLOBIN A1C  06/13/2024   Diabetic kidney evaluation - eGFR measurement  12/14/2024   Colonoscopy  03/16/2025   DTaP/Tdap/Td (3 - Td or Tdap) 02/12/2032   Influenza Vaccine  Completed   Hepatitis C Screening  Completed   HIV Screening  Completed   HPV VACCINES  Aged Out   Meningococcal B Vaccine  Aged Out    Discussed health benefits of physical activity, and encouraged him to engage in regular exercise appropriate for his age and condition.  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 Reviewed medications. Encouraged continuing healthy diet and incorporating movement into daily routine. - Encouraged weight management and physical activity. - Recommended diabetic eye exam at [facility]. - Recommend patient  schedule colonoscopy. - Counseled on vaccines, declines at this time  Muscle spasm Patient with lumbar back muscle spasm exacerbated by activity. Worse at night, previously relieved with muscle relaxers. - Prescribed flexeril  5mg  PRN for use as needed at night.  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 Diabetes in remission, A1c 5.4 after weight loss. Metformin  discontinued. - Continue monitoring A1c annually. - Encouraged dietary management to prevent high sugar intake. - Will obtain UACr   Return in about 1 year (around 03/01/2025) for Annual Physical Exam.     Isaiah DELENA Pepper, MD  [facility] [phone] (phone) [phone] (fax)

## 2024-03-02 ENCOUNTER — Ambulatory Visit: Payer: Self-pay

## 2024-03-02 LAB — MICROALBUMIN / CREATININE URINE RATIO
Creatinine, Urine: 43.1 mg/dL
Microalb/Creat Ratio: 7 mg/g{creat} (ref 0–29)
Microalbumin, Urine: 3.1 ug/mL

## 2025-03-05 ENCOUNTER — Encounter
